# Patient Record
Sex: Male | Born: 1950
Health system: Southern US, Community
[De-identification: ages and names within clinical notes are randomized; demographics above are authoritative.]

## PROBLEM LIST (undated history)

## (undated) DIAGNOSIS — E785 Hyperlipidemia, unspecified: Secondary | ICD-10-CM

## (undated) DIAGNOSIS — D509 Iron deficiency anemia, unspecified: Secondary | ICD-10-CM

## (undated) DIAGNOSIS — I219 Acute myocardial infarction, unspecified: Secondary | ICD-10-CM

## (undated) DIAGNOSIS — G4730 Sleep apnea, unspecified: Secondary | ICD-10-CM

## (undated) DIAGNOSIS — J449 Chronic obstructive pulmonary disease, unspecified: Secondary | ICD-10-CM

## (undated) DIAGNOSIS — I2510 Atherosclerotic heart disease of native coronary artery without angina pectoris: Secondary | ICD-10-CM

## (undated) DIAGNOSIS — N200 Calculus of kidney: Secondary | ICD-10-CM

## (undated) DIAGNOSIS — I519 Heart disease, unspecified: Secondary | ICD-10-CM

## (undated) DIAGNOSIS — J439 Emphysema, unspecified: Secondary | ICD-10-CM

## (undated) DIAGNOSIS — I340 Nonrheumatic mitral (valve) insufficiency: Secondary | ICD-10-CM

## (undated) DIAGNOSIS — K219 Gastro-esophageal reflux disease without esophagitis: Secondary | ICD-10-CM

## (undated) DIAGNOSIS — Z720 Tobacco use: Secondary | ICD-10-CM

## (undated) DIAGNOSIS — E119 Type 2 diabetes mellitus without complications: Secondary | ICD-10-CM

## (undated) DIAGNOSIS — I10 Essential (primary) hypertension: Secondary | ICD-10-CM

## (undated) HISTORY — DX: Emphysema, unspecified: J43.9

## (undated) HISTORY — PX: CARDIAC CATHETERIZATION: SHX172

## (undated) HISTORY — PX: CORONARY ANGIOPLASTY WITH STENT PLACEMENT: SHX49

## (undated) HISTORY — DX: Calculus of kidney: N20.0

## (undated) HISTORY — DX: Chronic obstructive pulmonary disease, unspecified: J44.9

## (undated) HISTORY — DX: Gastro-esophageal reflux disease without esophagitis: K21.9

## (undated) HISTORY — PX: LUMBAR LAMINECTOMY: SHX95

## (undated) HISTORY — PX: LITHOTRIPSY: SUR834

## (undated) HISTORY — PX: KIDNEY STONE SURGERY: SHX686

---

## 2002-08-13 ENCOUNTER — Inpatient Hospital Stay (HOSPITAL_COMMUNITY): Admission: EM | Admit: 2002-08-13 | Discharge: 2002-08-16 | Payer: Self-pay | Admitting: Psychiatry

## 2014-09-04 DIAGNOSIS — R5382 Chronic fatigue, unspecified: Secondary | ICD-10-CM | POA: Diagnosis not present

## 2014-09-04 DIAGNOSIS — M129 Arthropathy, unspecified: Secondary | ICD-10-CM | POA: Diagnosis not present

## 2014-09-04 DIAGNOSIS — I10 Essential (primary) hypertension: Secondary | ICD-10-CM | POA: Diagnosis not present

## 2014-09-04 DIAGNOSIS — I2510 Atherosclerotic heart disease of native coronary artery without angina pectoris: Secondary | ICD-10-CM | POA: Diagnosis not present

## 2014-09-04 DIAGNOSIS — G894 Chronic pain syndrome: Secondary | ICD-10-CM | POA: Diagnosis not present

## 2014-09-11 DIAGNOSIS — G4700 Insomnia, unspecified: Secondary | ICD-10-CM | POA: Diagnosis not present

## 2014-09-11 DIAGNOSIS — M5134 Other intervertebral disc degeneration, thoracic region: Secondary | ICD-10-CM | POA: Diagnosis not present

## 2014-09-11 DIAGNOSIS — R5382 Chronic fatigue, unspecified: Secondary | ICD-10-CM | POA: Diagnosis not present

## 2014-09-11 DIAGNOSIS — G894 Chronic pain syndrome: Secondary | ICD-10-CM | POA: Diagnosis not present

## 2014-09-23 DIAGNOSIS — C44329 Squamous cell carcinoma of skin of other parts of face: Secondary | ICD-10-CM | POA: Diagnosis not present

## 2014-10-09 DIAGNOSIS — M4806 Spinal stenosis, lumbar region: Secondary | ICD-10-CM | POA: Diagnosis not present

## 2014-10-13 DIAGNOSIS — F419 Anxiety disorder, unspecified: Secondary | ICD-10-CM | POA: Diagnosis not present

## 2014-10-13 DIAGNOSIS — I10 Essential (primary) hypertension: Secondary | ICD-10-CM | POA: Diagnosis not present

## 2014-10-13 DIAGNOSIS — G894 Chronic pain syndrome: Secondary | ICD-10-CM | POA: Diagnosis not present

## 2014-10-13 DIAGNOSIS — E119 Type 2 diabetes mellitus without complications: Secondary | ICD-10-CM | POA: Diagnosis not present

## 2014-10-29 DIAGNOSIS — M4806 Spinal stenosis, lumbar region: Secondary | ICD-10-CM | POA: Diagnosis not present

## 2014-10-29 DIAGNOSIS — Z6826 Body mass index (BMI) 26.0-26.9, adult: Secondary | ICD-10-CM | POA: Diagnosis not present

## 2014-11-11 DIAGNOSIS — G894 Chronic pain syndrome: Secondary | ICD-10-CM | POA: Diagnosis not present

## 2014-11-11 DIAGNOSIS — E119 Type 2 diabetes mellitus without complications: Secondary | ICD-10-CM | POA: Diagnosis not present

## 2014-11-11 DIAGNOSIS — E785 Hyperlipidemia, unspecified: Secondary | ICD-10-CM | POA: Diagnosis not present

## 2014-11-11 DIAGNOSIS — I10 Essential (primary) hypertension: Secondary | ICD-10-CM | POA: Diagnosis not present

## 2014-11-17 DIAGNOSIS — J209 Acute bronchitis, unspecified: Secondary | ICD-10-CM | POA: Diagnosis not present

## 2014-12-08 DIAGNOSIS — I10 Essential (primary) hypertension: Secondary | ICD-10-CM | POA: Diagnosis not present

## 2014-12-08 DIAGNOSIS — K219 Gastro-esophageal reflux disease without esophagitis: Secondary | ICD-10-CM | POA: Diagnosis not present

## 2014-12-08 DIAGNOSIS — G8929 Other chronic pain: Secondary | ICD-10-CM | POA: Diagnosis not present

## 2014-12-08 DIAGNOSIS — F419 Anxiety disorder, unspecified: Secondary | ICD-10-CM | POA: Diagnosis not present

## 2014-12-31 DIAGNOSIS — M549 Dorsalgia, unspecified: Secondary | ICD-10-CM | POA: Diagnosis not present

## 2014-12-31 DIAGNOSIS — G8929 Other chronic pain: Secondary | ICD-10-CM | POA: Diagnosis not present

## 2014-12-31 DIAGNOSIS — Z79891 Long term (current) use of opiate analgesic: Secondary | ICD-10-CM | POA: Diagnosis not present

## 2014-12-31 DIAGNOSIS — Z760 Encounter for issue of repeat prescription: Secondary | ICD-10-CM | POA: Diagnosis not present

## 2015-01-01 DIAGNOSIS — E7800 Pure hypercholesterolemia, unspecified: Secondary | ICD-10-CM | POA: Diagnosis not present

## 2015-01-01 DIAGNOSIS — I10 Essential (primary) hypertension: Secondary | ICD-10-CM | POA: Diagnosis not present

## 2015-01-01 DIAGNOSIS — I2510 Atherosclerotic heart disease of native coronary artery without angina pectoris: Secondary | ICD-10-CM | POA: Diagnosis not present

## 2015-01-01 DIAGNOSIS — K7030 Alcoholic cirrhosis of liver without ascites: Secondary | ICD-10-CM | POA: Diagnosis not present

## 2015-01-01 DIAGNOSIS — D649 Anemia, unspecified: Secondary | ICD-10-CM | POA: Diagnosis not present

## 2015-01-05 DIAGNOSIS — I10 Essential (primary) hypertension: Secondary | ICD-10-CM | POA: Diagnosis not present

## 2015-01-05 DIAGNOSIS — Z79891 Long term (current) use of opiate analgesic: Secondary | ICD-10-CM | POA: Diagnosis not present

## 2015-01-05 DIAGNOSIS — Z5181 Encounter for therapeutic drug level monitoring: Secondary | ICD-10-CM | POA: Diagnosis not present

## 2015-01-05 DIAGNOSIS — E119 Type 2 diabetes mellitus without complications: Secondary | ICD-10-CM | POA: Diagnosis not present

## 2015-01-05 DIAGNOSIS — M129 Arthropathy, unspecified: Secondary | ICD-10-CM | POA: Diagnosis not present

## 2015-01-05 DIAGNOSIS — G894 Chronic pain syndrome: Secondary | ICD-10-CM | POA: Diagnosis not present

## 2015-01-05 DIAGNOSIS — F419 Anxiety disorder, unspecified: Secondary | ICD-10-CM | POA: Diagnosis not present

## 2015-01-08 DIAGNOSIS — E119 Type 2 diabetes mellitus without complications: Secondary | ICD-10-CM | POA: Diagnosis not present

## 2015-02-04 DIAGNOSIS — G8929 Other chronic pain: Secondary | ICD-10-CM | POA: Diagnosis not present

## 2015-02-04 DIAGNOSIS — I10 Essential (primary) hypertension: Secondary | ICD-10-CM | POA: Diagnosis not present

## 2015-02-04 DIAGNOSIS — K219 Gastro-esophageal reflux disease without esophagitis: Secondary | ICD-10-CM | POA: Diagnosis not present

## 2015-02-04 DIAGNOSIS — E119 Type 2 diabetes mellitus without complications: Secondary | ICD-10-CM | POA: Diagnosis not present

## 2015-02-04 DIAGNOSIS — G4700 Insomnia, unspecified: Secondary | ICD-10-CM | POA: Diagnosis not present

## 2015-02-06 DIAGNOSIS — J181 Lobar pneumonia, unspecified organism: Secondary | ICD-10-CM | POA: Diagnosis not present

## 2015-02-06 DIAGNOSIS — N390 Urinary tract infection, site not specified: Secondary | ICD-10-CM | POA: Diagnosis present

## 2015-02-06 DIAGNOSIS — I358 Other nonrheumatic aortic valve disorders: Secondary | ICD-10-CM | POA: Diagnosis not present

## 2015-02-06 DIAGNOSIS — I2510 Atherosclerotic heart disease of native coronary artery without angina pectoris: Secondary | ICD-10-CM | POA: Diagnosis not present

## 2015-02-06 DIAGNOSIS — Z7902 Long term (current) use of antithrombotics/antiplatelets: Secondary | ICD-10-CM | POA: Diagnosis not present

## 2015-02-06 DIAGNOSIS — I110 Hypertensive heart disease with heart failure: Secondary | ICD-10-CM | POA: Diagnosis present

## 2015-02-06 DIAGNOSIS — I10 Essential (primary) hypertension: Secondary | ICD-10-CM | POA: Diagnosis not present

## 2015-02-06 DIAGNOSIS — I5031 Acute diastolic (congestive) heart failure: Secondary | ICD-10-CM | POA: Diagnosis present

## 2015-02-06 DIAGNOSIS — Z7982 Long term (current) use of aspirin: Secondary | ICD-10-CM | POA: Diagnosis not present

## 2015-02-06 DIAGNOSIS — E785 Hyperlipidemia, unspecified: Secondary | ICD-10-CM | POA: Diagnosis not present

## 2015-02-06 DIAGNOSIS — E119 Type 2 diabetes mellitus without complications: Secondary | ICD-10-CM | POA: Diagnosis not present

## 2015-02-06 DIAGNOSIS — R0902 Hypoxemia: Secondary | ICD-10-CM | POA: Diagnosis not present

## 2015-02-06 DIAGNOSIS — I340 Nonrheumatic mitral (valve) insufficiency: Secondary | ICD-10-CM | POA: Diagnosis not present

## 2015-02-06 DIAGNOSIS — J811 Chronic pulmonary edema: Secondary | ICD-10-CM | POA: Diagnosis not present

## 2015-02-06 DIAGNOSIS — Z951 Presence of aortocoronary bypass graft: Secondary | ICD-10-CM | POA: Diagnosis not present

## 2015-02-06 DIAGNOSIS — J159 Unspecified bacterial pneumonia: Secondary | ICD-10-CM | POA: Diagnosis not present

## 2015-02-06 DIAGNOSIS — F17218 Nicotine dependence, cigarettes, with other nicotine-induced disorders: Secondary | ICD-10-CM | POA: Diagnosis present

## 2015-02-06 DIAGNOSIS — I517 Cardiomegaly: Secondary | ICD-10-CM | POA: Diagnosis not present

## 2015-02-06 DIAGNOSIS — B952 Enterococcus as the cause of diseases classified elsewhere: Secondary | ICD-10-CM | POA: Diagnosis present

## 2015-02-06 DIAGNOSIS — G8929 Other chronic pain: Secondary | ICD-10-CM | POA: Diagnosis present

## 2015-02-06 DIAGNOSIS — I214 Non-ST elevation (NSTEMI) myocardial infarction: Secondary | ICD-10-CM | POA: Diagnosis not present

## 2015-02-06 DIAGNOSIS — R0789 Other chest pain: Secondary | ICD-10-CM | POA: Diagnosis not present

## 2015-02-06 DIAGNOSIS — R0600 Dyspnea, unspecified: Secondary | ICD-10-CM | POA: Diagnosis not present

## 2015-02-06 DIAGNOSIS — J441 Chronic obstructive pulmonary disease with (acute) exacerbation: Secondary | ICD-10-CM | POA: Diagnosis not present

## 2015-02-06 DIAGNOSIS — Z79899 Other long term (current) drug therapy: Secondary | ICD-10-CM | POA: Diagnosis not present

## 2015-02-06 DIAGNOSIS — I479 Paroxysmal tachycardia, unspecified: Secondary | ICD-10-CM | POA: Diagnosis not present

## 2015-02-06 DIAGNOSIS — M545 Low back pain: Secondary | ICD-10-CM | POA: Diagnosis not present

## 2015-02-06 DIAGNOSIS — R9431 Abnormal electrocardiogram [ECG] [EKG]: Secondary | ICD-10-CM | POA: Diagnosis not present

## 2015-02-06 DIAGNOSIS — Z716 Tobacco abuse counseling: Secondary | ICD-10-CM | POA: Diagnosis not present

## 2015-02-06 DIAGNOSIS — K219 Gastro-esophageal reflux disease without esophagitis: Secondary | ICD-10-CM | POA: Diagnosis present

## 2015-02-06 DIAGNOSIS — J9601 Acute respiratory failure with hypoxia: Secondary | ICD-10-CM | POA: Diagnosis not present

## 2015-02-06 DIAGNOSIS — R509 Fever, unspecified: Secondary | ICD-10-CM | POA: Diagnosis not present

## 2015-02-06 DIAGNOSIS — R079 Chest pain, unspecified: Secondary | ICD-10-CM | POA: Diagnosis not present

## 2015-02-26 DIAGNOSIS — E7800 Pure hypercholesterolemia, unspecified: Secondary | ICD-10-CM | POA: Diagnosis not present

## 2015-02-26 DIAGNOSIS — I10 Essential (primary) hypertension: Secondary | ICD-10-CM | POA: Diagnosis not present

## 2015-02-26 DIAGNOSIS — I229 Subsequent ST elevation (STEMI) myocardial infarction of unspecified site: Secondary | ICD-10-CM | POA: Diagnosis not present

## 2015-02-26 DIAGNOSIS — I2510 Atherosclerotic heart disease of native coronary artery without angina pectoris: Secondary | ICD-10-CM | POA: Diagnosis not present

## 2015-03-12 DIAGNOSIS — S0101XA Laceration without foreign body of scalp, initial encounter: Secondary | ICD-10-CM | POA: Diagnosis not present

## 2015-03-12 DIAGNOSIS — Z23 Encounter for immunization: Secondary | ICD-10-CM | POA: Diagnosis not present

## 2015-03-12 DIAGNOSIS — F419 Anxiety disorder, unspecified: Secondary | ICD-10-CM | POA: Diagnosis not present

## 2015-03-12 DIAGNOSIS — F17200 Nicotine dependence, unspecified, uncomplicated: Secondary | ICD-10-CM | POA: Diagnosis not present

## 2015-03-12 DIAGNOSIS — S0003XA Contusion of scalp, initial encounter: Secondary | ICD-10-CM | POA: Diagnosis not present

## 2015-03-12 DIAGNOSIS — Z79899 Other long term (current) drug therapy: Secondary | ICD-10-CM | POA: Diagnosis not present

## 2015-03-12 DIAGNOSIS — M542 Cervicalgia: Secondary | ICD-10-CM | POA: Diagnosis not present

## 2015-03-12 DIAGNOSIS — Z7902 Long term (current) use of antithrombotics/antiplatelets: Secondary | ICD-10-CM | POA: Diagnosis not present

## 2015-03-12 DIAGNOSIS — R51 Headache: Secondary | ICD-10-CM | POA: Diagnosis not present

## 2015-03-12 DIAGNOSIS — I2510 Atherosclerotic heart disease of native coronary artery without angina pectoris: Secondary | ICD-10-CM | POA: Diagnosis not present

## 2015-03-12 DIAGNOSIS — J449 Chronic obstructive pulmonary disease, unspecified: Secondary | ICD-10-CM | POA: Diagnosis not present

## 2015-03-12 DIAGNOSIS — I10 Essential (primary) hypertension: Secondary | ICD-10-CM | POA: Diagnosis not present

## 2015-03-12 DIAGNOSIS — K219 Gastro-esophageal reflux disease without esophagitis: Secondary | ICD-10-CM | POA: Diagnosis not present

## 2015-03-12 DIAGNOSIS — E7800 Pure hypercholesterolemia, unspecified: Secondary | ICD-10-CM | POA: Diagnosis not present

## 2015-03-12 DIAGNOSIS — E119 Type 2 diabetes mellitus without complications: Secondary | ICD-10-CM | POA: Diagnosis not present

## 2015-03-12 DIAGNOSIS — S01111A Laceration without foreign body of right eyelid and periocular area, initial encounter: Secondary | ICD-10-CM | POA: Diagnosis not present

## 2015-03-12 DIAGNOSIS — Z7982 Long term (current) use of aspirin: Secondary | ICD-10-CM | POA: Diagnosis not present

## 2015-04-01 DIAGNOSIS — G8929 Other chronic pain: Secondary | ICD-10-CM | POA: Diagnosis not present

## 2015-04-01 DIAGNOSIS — M545 Low back pain: Secondary | ICD-10-CM | POA: Diagnosis not present

## 2015-04-01 DIAGNOSIS — Z9114 Patient's other noncompliance with medication regimen: Secondary | ICD-10-CM | POA: Diagnosis not present

## 2015-04-02 DIAGNOSIS — G894 Chronic pain syndrome: Secondary | ICD-10-CM | POA: Diagnosis not present

## 2015-04-02 DIAGNOSIS — F419 Anxiety disorder, unspecified: Secondary | ICD-10-CM | POA: Diagnosis not present

## 2015-04-02 DIAGNOSIS — M129 Arthropathy, unspecified: Secondary | ICD-10-CM | POA: Diagnosis not present

## 2015-04-02 DIAGNOSIS — I10 Essential (primary) hypertension: Secondary | ICD-10-CM | POA: Diagnosis not present

## 2015-04-09 DIAGNOSIS — R079 Chest pain, unspecified: Secondary | ICD-10-CM | POA: Diagnosis not present

## 2015-04-29 DIAGNOSIS — F419 Anxiety disorder, unspecified: Secondary | ICD-10-CM | POA: Diagnosis not present

## 2015-04-29 DIAGNOSIS — R5383 Other fatigue: Secondary | ICD-10-CM | POA: Diagnosis not present

## 2015-04-29 DIAGNOSIS — I2510 Atherosclerotic heart disease of native coronary artery without angina pectoris: Secondary | ICD-10-CM | POA: Diagnosis not present

## 2015-04-29 DIAGNOSIS — G894 Chronic pain syndrome: Secondary | ICD-10-CM | POA: Diagnosis not present

## 2015-05-05 DIAGNOSIS — R000 Tachycardia, unspecified: Secondary | ICD-10-CM | POA: Diagnosis not present

## 2015-05-05 DIAGNOSIS — I2510 Atherosclerotic heart disease of native coronary artery without angina pectoris: Secondary | ICD-10-CM | POA: Diagnosis not present

## 2015-06-04 DIAGNOSIS — D649 Anemia, unspecified: Secondary | ICD-10-CM | POA: Diagnosis not present

## 2015-06-04 DIAGNOSIS — I214 Non-ST elevation (NSTEMI) myocardial infarction: Secondary | ICD-10-CM | POA: Diagnosis not present

## 2015-06-04 DIAGNOSIS — I10 Essential (primary) hypertension: Secondary | ICD-10-CM | POA: Diagnosis not present

## 2015-06-04 DIAGNOSIS — R079 Chest pain, unspecified: Secondary | ICD-10-CM | POA: Diagnosis not present

## 2015-06-04 DIAGNOSIS — E119 Type 2 diabetes mellitus without complications: Secondary | ICD-10-CM | POA: Diagnosis not present

## 2015-06-04 DIAGNOSIS — J069 Acute upper respiratory infection, unspecified: Secondary | ICD-10-CM | POA: Diagnosis not present

## 2015-06-05 DIAGNOSIS — I10 Essential (primary) hypertension: Secondary | ICD-10-CM | POA: Diagnosis not present

## 2015-06-05 DIAGNOSIS — E119 Type 2 diabetes mellitus without complications: Secondary | ICD-10-CM | POA: Diagnosis not present

## 2015-06-05 DIAGNOSIS — R079 Chest pain, unspecified: Secondary | ICD-10-CM | POA: Diagnosis not present

## 2015-06-05 DIAGNOSIS — I214 Non-ST elevation (NSTEMI) myocardial infarction: Secondary | ICD-10-CM | POA: Diagnosis not present

## 2015-06-05 DIAGNOSIS — D649 Anemia, unspecified: Secondary | ICD-10-CM | POA: Diagnosis not present

## 2015-06-05 DIAGNOSIS — J069 Acute upper respiratory infection, unspecified: Secondary | ICD-10-CM | POA: Diagnosis not present

## 2015-06-06 DIAGNOSIS — R079 Chest pain, unspecified: Secondary | ICD-10-CM | POA: Diagnosis not present

## 2015-06-06 DIAGNOSIS — J069 Acute upper respiratory infection, unspecified: Secondary | ICD-10-CM | POA: Diagnosis not present

## 2015-06-06 DIAGNOSIS — I10 Essential (primary) hypertension: Secondary | ICD-10-CM | POA: Diagnosis not present

## 2015-06-06 DIAGNOSIS — E119 Type 2 diabetes mellitus without complications: Secondary | ICD-10-CM | POA: Diagnosis not present

## 2015-06-06 DIAGNOSIS — D649 Anemia, unspecified: Secondary | ICD-10-CM | POA: Diagnosis not present

## 2015-06-07 DIAGNOSIS — E119 Type 2 diabetes mellitus without complications: Secondary | ICD-10-CM | POA: Diagnosis not present

## 2015-06-07 DIAGNOSIS — J069 Acute upper respiratory infection, unspecified: Secondary | ICD-10-CM | POA: Diagnosis not present

## 2015-06-07 DIAGNOSIS — D649 Anemia, unspecified: Secondary | ICD-10-CM | POA: Diagnosis not present

## 2015-06-07 DIAGNOSIS — I10 Essential (primary) hypertension: Secondary | ICD-10-CM | POA: Diagnosis not present

## 2015-06-07 DIAGNOSIS — R079 Chest pain, unspecified: Secondary | ICD-10-CM | POA: Diagnosis not present

## 2015-09-16 DIAGNOSIS — F331 Major depressive disorder, recurrent, moderate: Secondary | ICD-10-CM | POA: Diagnosis not present

## 2015-09-22 DIAGNOSIS — R531 Weakness: Secondary | ICD-10-CM | POA: Diagnosis not present

## 2015-09-22 DIAGNOSIS — R918 Other nonspecific abnormal finding of lung field: Secondary | ICD-10-CM | POA: Diagnosis not present

## 2015-09-22 DIAGNOSIS — I249 Acute ischemic heart disease, unspecified: Secondary | ICD-10-CM | POA: Diagnosis not present

## 2015-09-23 DIAGNOSIS — R531 Weakness: Secondary | ICD-10-CM | POA: Diagnosis not present

## 2015-09-23 DIAGNOSIS — R918 Other nonspecific abnormal finding of lung field: Secondary | ICD-10-CM | POA: Diagnosis not present

## 2015-09-23 DIAGNOSIS — I249 Acute ischemic heart disease, unspecified: Secondary | ICD-10-CM | POA: Diagnosis not present

## 2015-09-23 DIAGNOSIS — F17210 Nicotine dependence, cigarettes, uncomplicated: Secondary | ICD-10-CM | POA: Diagnosis present

## 2015-09-23 DIAGNOSIS — I10 Essential (primary) hypertension: Secondary | ICD-10-CM | POA: Diagnosis present

## 2015-09-23 DIAGNOSIS — E119 Type 2 diabetes mellitus without complications: Secondary | ICD-10-CM | POA: Diagnosis not present

## 2015-09-23 DIAGNOSIS — Z79899 Other long term (current) drug therapy: Secondary | ICD-10-CM | POA: Diagnosis not present

## 2015-09-23 DIAGNOSIS — R748 Abnormal levels of other serum enzymes: Secondary | ICD-10-CM | POA: Diagnosis not present

## 2015-09-23 DIAGNOSIS — J449 Chronic obstructive pulmonary disease, unspecified: Secondary | ICD-10-CM | POA: Diagnosis not present

## 2015-09-23 DIAGNOSIS — Z7982 Long term (current) use of aspirin: Secondary | ICD-10-CM | POA: Diagnosis not present

## 2015-09-23 DIAGNOSIS — I2510 Atherosclerotic heart disease of native coronary artery without angina pectoris: Secondary | ICD-10-CM | POA: Diagnosis not present

## 2015-09-23 DIAGNOSIS — E11649 Type 2 diabetes mellitus with hypoglycemia without coma: Secondary | ICD-10-CM | POA: Diagnosis present

## 2015-09-23 DIAGNOSIS — I252 Old myocardial infarction: Secondary | ICD-10-CM | POA: Diagnosis not present

## 2015-09-23 DIAGNOSIS — M1990 Unspecified osteoarthritis, unspecified site: Secondary | ICD-10-CM | POA: Diagnosis present

## 2015-09-23 DIAGNOSIS — Z9861 Coronary angioplasty status: Secondary | ICD-10-CM | POA: Diagnosis not present

## 2015-09-23 DIAGNOSIS — E7800 Pure hypercholesterolemia, unspecified: Secondary | ICD-10-CM | POA: Diagnosis present

## 2015-09-23 DIAGNOSIS — E785 Hyperlipidemia, unspecified: Secondary | ICD-10-CM | POA: Diagnosis not present

## 2015-09-23 DIAGNOSIS — K219 Gastro-esophageal reflux disease without esophagitis: Secondary | ICD-10-CM | POA: Diagnosis present

## 2015-09-23 DIAGNOSIS — F418 Other specified anxiety disorders: Secondary | ICD-10-CM | POA: Diagnosis present

## 2015-09-23 DIAGNOSIS — I214 Non-ST elevation (NSTEMI) myocardial infarction: Secondary | ICD-10-CM | POA: Diagnosis not present

## 2015-09-23 DIAGNOSIS — R079 Chest pain, unspecified: Secondary | ICD-10-CM | POA: Diagnosis not present

## 2015-09-23 DIAGNOSIS — R42 Dizziness and giddiness: Secondary | ICD-10-CM | POA: Diagnosis not present

## 2015-10-08 DIAGNOSIS — I2510 Atherosclerotic heart disease of native coronary artery without angina pectoris: Secondary | ICD-10-CM | POA: Diagnosis not present

## 2015-10-08 DIAGNOSIS — I10 Essential (primary) hypertension: Secondary | ICD-10-CM | POA: Diagnosis not present

## 2015-10-08 DIAGNOSIS — E7800 Pure hypercholesterolemia, unspecified: Secondary | ICD-10-CM | POA: Diagnosis not present

## 2015-10-10 DIAGNOSIS — R51 Headache: Secondary | ICD-10-CM | POA: Diagnosis not present

## 2015-10-10 DIAGNOSIS — R4182 Altered mental status, unspecified: Secondary | ICD-10-CM | POA: Diagnosis not present

## 2015-10-10 DIAGNOSIS — R402441 Other coma, without documented Glasgow coma scale score, or with partial score reported, in the field [EMT or ambulance]: Secondary | ICD-10-CM | POA: Diagnosis not present

## 2015-11-19 DIAGNOSIS — G894 Chronic pain syndrome: Secondary | ICD-10-CM | POA: Diagnosis not present

## 2015-12-21 DIAGNOSIS — L259 Unspecified contact dermatitis, unspecified cause: Secondary | ICD-10-CM | POA: Diagnosis not present

## 2015-12-21 DIAGNOSIS — M1990 Unspecified osteoarthritis, unspecified site: Secondary | ICD-10-CM | POA: Diagnosis not present

## 2015-12-21 DIAGNOSIS — F419 Anxiety disorder, unspecified: Secondary | ICD-10-CM | POA: Diagnosis not present

## 2015-12-21 DIAGNOSIS — G894 Chronic pain syndrome: Secondary | ICD-10-CM | POA: Diagnosis not present

## 2016-01-09 DIAGNOSIS — J189 Pneumonia, unspecified organism: Secondary | ICD-10-CM | POA: Diagnosis not present

## 2016-01-21 DIAGNOSIS — M1990 Unspecified osteoarthritis, unspecified site: Secondary | ICD-10-CM | POA: Diagnosis not present

## 2016-01-21 DIAGNOSIS — F419 Anxiety disorder, unspecified: Secondary | ICD-10-CM | POA: Diagnosis not present

## 2016-01-21 DIAGNOSIS — G894 Chronic pain syndrome: Secondary | ICD-10-CM | POA: Diagnosis not present

## 2016-02-23 DIAGNOSIS — M1990 Unspecified osteoarthritis, unspecified site: Secondary | ICD-10-CM | POA: Diagnosis not present

## 2016-02-23 DIAGNOSIS — I10 Essential (primary) hypertension: Secondary | ICD-10-CM | POA: Diagnosis not present

## 2016-02-23 DIAGNOSIS — G894 Chronic pain syndrome: Secondary | ICD-10-CM | POA: Diagnosis not present

## 2016-02-23 DIAGNOSIS — F419 Anxiety disorder, unspecified: Secondary | ICD-10-CM | POA: Diagnosis not present

## 2016-03-11 DIAGNOSIS — S0990XA Unspecified injury of head, initial encounter: Secondary | ICD-10-CM | POA: Diagnosis not present

## 2016-03-11 DIAGNOSIS — S0101XA Laceration without foreign body of scalp, initial encounter: Secondary | ICD-10-CM | POA: Diagnosis not present

## 2016-03-21 DIAGNOSIS — L578 Other skin changes due to chronic exposure to nonionizing radiation: Secondary | ICD-10-CM | POA: Diagnosis not present

## 2016-03-21 DIAGNOSIS — L821 Other seborrheic keratosis: Secondary | ICD-10-CM | POA: Diagnosis not present

## 2016-03-21 DIAGNOSIS — L570 Actinic keratosis: Secondary | ICD-10-CM | POA: Diagnosis not present

## 2016-03-21 DIAGNOSIS — C44519 Basal cell carcinoma of skin of other part of trunk: Secondary | ICD-10-CM | POA: Diagnosis not present

## 2016-04-03 DIAGNOSIS — Z7982 Long term (current) use of aspirin: Secondary | ICD-10-CM | POA: Diagnosis not present

## 2016-04-03 DIAGNOSIS — J449 Chronic obstructive pulmonary disease, unspecified: Secondary | ICD-10-CM | POA: Diagnosis not present

## 2016-04-03 DIAGNOSIS — S0990XA Unspecified injury of head, initial encounter: Secondary | ICD-10-CM | POA: Diagnosis not present

## 2016-04-03 DIAGNOSIS — R531 Weakness: Secondary | ICD-10-CM | POA: Diagnosis not present

## 2016-04-03 DIAGNOSIS — S0121XA Laceration without foreign body of nose, initial encounter: Secondary | ICD-10-CM | POA: Diagnosis not present

## 2016-04-03 DIAGNOSIS — F17200 Nicotine dependence, unspecified, uncomplicated: Secondary | ICD-10-CM | POA: Diagnosis not present

## 2016-04-03 DIAGNOSIS — F17218 Nicotine dependence, cigarettes, with other nicotine-induced disorders: Secondary | ICD-10-CM | POA: Diagnosis not present

## 2016-04-03 DIAGNOSIS — I2510 Atherosclerotic heart disease of native coronary artery without angina pectoris: Secondary | ICD-10-CM | POA: Diagnosis not present

## 2016-04-03 DIAGNOSIS — Z7902 Long term (current) use of antithrombotics/antiplatelets: Secondary | ICD-10-CM | POA: Diagnosis not present

## 2016-04-03 DIAGNOSIS — Z79899 Other long term (current) drug therapy: Secondary | ICD-10-CM | POA: Diagnosis not present

## 2016-04-03 DIAGNOSIS — K219 Gastro-esophageal reflux disease without esophagitis: Secondary | ICD-10-CM | POA: Diagnosis not present

## 2016-04-03 DIAGNOSIS — E119 Type 2 diabetes mellitus without complications: Secondary | ICD-10-CM | POA: Diagnosis not present

## 2016-04-03 DIAGNOSIS — I252 Old myocardial infarction: Secondary | ICD-10-CM

## 2016-04-03 DIAGNOSIS — Z716 Tobacco abuse counseling: Secondary | ICD-10-CM | POA: Diagnosis not present

## 2016-04-03 DIAGNOSIS — R4182 Altered mental status, unspecified: Secondary | ICD-10-CM

## 2016-04-03 DIAGNOSIS — R42 Dizziness and giddiness: Secondary | ICD-10-CM | POA: Diagnosis not present

## 2016-04-03 DIAGNOSIS — R0602 Shortness of breath: Secondary | ICD-10-CM | POA: Diagnosis not present

## 2016-04-04 DIAGNOSIS — S0990XA Unspecified injury of head, initial encounter: Secondary | ICD-10-CM | POA: Diagnosis not present

## 2016-04-04 DIAGNOSIS — I6523 Occlusion and stenosis of bilateral carotid arteries: Secondary | ICD-10-CM | POA: Diagnosis not present

## 2016-04-04 DIAGNOSIS — I2510 Atherosclerotic heart disease of native coronary artery without angina pectoris: Secondary | ICD-10-CM | POA: Diagnosis not present

## 2016-04-04 DIAGNOSIS — I517 Cardiomegaly: Secondary | ICD-10-CM | POA: Diagnosis not present

## 2016-04-04 DIAGNOSIS — I252 Old myocardial infarction: Secondary | ICD-10-CM | POA: Diagnosis not present

## 2016-04-04 DIAGNOSIS — I350 Nonrheumatic aortic (valve) stenosis: Secondary | ICD-10-CM | POA: Diagnosis not present

## 2016-04-04 DIAGNOSIS — R4182 Altered mental status, unspecified: Secondary | ICD-10-CM | POA: Diagnosis not present

## 2016-04-04 DIAGNOSIS — Z951 Presence of aortocoronary bypass graft: Secondary | ICD-10-CM | POA: Diagnosis not present

## 2016-04-04 DIAGNOSIS — Z8249 Family history of ischemic heart disease and other diseases of the circulatory system: Secondary | ICD-10-CM | POA: Diagnosis not present

## 2016-04-04 DIAGNOSIS — F17210 Nicotine dependence, cigarettes, uncomplicated: Secondary | ICD-10-CM | POA: Diagnosis not present

## 2016-04-12 DIAGNOSIS — R05 Cough: Secondary | ICD-10-CM | POA: Diagnosis not present

## 2016-04-12 DIAGNOSIS — R0600 Dyspnea, unspecified: Secondary | ICD-10-CM | POA: Diagnosis not present

## 2016-04-12 DIAGNOSIS — R112 Nausea with vomiting, unspecified: Secondary | ICD-10-CM | POA: Diagnosis not present

## 2016-04-12 DIAGNOSIS — R062 Wheezing: Secondary | ICD-10-CM | POA: Diagnosis not present

## 2016-04-12 DIAGNOSIS — R0602 Shortness of breath: Secondary | ICD-10-CM | POA: Diagnosis not present

## 2016-04-13 DIAGNOSIS — F17210 Nicotine dependence, cigarettes, uncomplicated: Secondary | ICD-10-CM | POA: Diagnosis not present

## 2016-04-13 DIAGNOSIS — I10 Essential (primary) hypertension: Secondary | ICD-10-CM | POA: Diagnosis not present

## 2016-04-13 DIAGNOSIS — E785 Hyperlipidemia, unspecified: Secondary | ICD-10-CM | POA: Diagnosis not present

## 2016-04-13 DIAGNOSIS — I2510 Atherosclerotic heart disease of native coronary artery without angina pectoris: Secondary | ICD-10-CM | POA: Diagnosis not present

## 2016-05-07 DIAGNOSIS — R300 Dysuria: Secondary | ICD-10-CM | POA: Diagnosis not present

## 2016-05-07 DIAGNOSIS — N3090 Cystitis, unspecified without hematuria: Secondary | ICD-10-CM | POA: Diagnosis not present

## 2016-05-07 DIAGNOSIS — N419 Inflammatory disease of prostate, unspecified: Secondary | ICD-10-CM | POA: Diagnosis not present

## 2016-05-13 DIAGNOSIS — I6789 Other cerebrovascular disease: Secondary | ICD-10-CM | POA: Diagnosis not present

## 2016-05-13 DIAGNOSIS — F13231 Sedative, hypnotic or anxiolytic dependence with withdrawal delirium: Secondary | ICD-10-CM

## 2016-05-13 DIAGNOSIS — E785 Hyperlipidemia, unspecified: Secondary | ICD-10-CM

## 2016-05-13 DIAGNOSIS — S0990XA Unspecified injury of head, initial encounter: Secondary | ICD-10-CM | POA: Diagnosis not present

## 2016-05-13 DIAGNOSIS — K219 Gastro-esophageal reflux disease without esophagitis: Secondary | ICD-10-CM | POA: Diagnosis not present

## 2016-05-13 DIAGNOSIS — F1310 Sedative, hypnotic or anxiolytic abuse, uncomplicated: Secondary | ICD-10-CM | POA: Diagnosis not present

## 2016-05-13 DIAGNOSIS — E119 Type 2 diabetes mellitus without complications: Secondary | ICD-10-CM | POA: Diagnosis not present

## 2016-05-13 DIAGNOSIS — F1110 Opioid abuse, uncomplicated: Secondary | ICD-10-CM | POA: Diagnosis not present

## 2016-05-13 DIAGNOSIS — R4182 Altered mental status, unspecified: Secondary | ICD-10-CM | POA: Diagnosis not present

## 2016-05-13 DIAGNOSIS — F4489 Other dissociative and conversion disorders: Secondary | ICD-10-CM | POA: Diagnosis not present

## 2016-05-13 DIAGNOSIS — I2510 Atherosclerotic heart disease of native coronary artery without angina pectoris: Secondary | ICD-10-CM

## 2016-05-14 DIAGNOSIS — E785 Hyperlipidemia, unspecified: Secondary | ICD-10-CM | POA: Diagnosis not present

## 2016-05-14 DIAGNOSIS — K219 Gastro-esophageal reflux disease without esophagitis: Secondary | ICD-10-CM | POA: Diagnosis not present

## 2016-05-14 DIAGNOSIS — F1310 Sedative, hypnotic or anxiolytic abuse, uncomplicated: Secondary | ICD-10-CM | POA: Diagnosis not present

## 2016-05-14 DIAGNOSIS — E119 Type 2 diabetes mellitus without complications: Secondary | ICD-10-CM | POA: Diagnosis not present

## 2016-05-14 DIAGNOSIS — F13231 Sedative, hypnotic or anxiolytic dependence with withdrawal delirium: Secondary | ICD-10-CM | POA: Diagnosis not present

## 2016-05-14 DIAGNOSIS — I2510 Atherosclerotic heart disease of native coronary artery without angina pectoris: Secondary | ICD-10-CM | POA: Diagnosis not present

## 2016-05-14 DIAGNOSIS — F1110 Opioid abuse, uncomplicated: Secondary | ICD-10-CM | POA: Diagnosis not present

## 2016-05-15 DIAGNOSIS — F1310 Sedative, hypnotic or anxiolytic abuse, uncomplicated: Secondary | ICD-10-CM | POA: Diagnosis not present

## 2016-05-15 DIAGNOSIS — K219 Gastro-esophageal reflux disease without esophagitis: Secondary | ICD-10-CM | POA: Diagnosis not present

## 2016-05-15 DIAGNOSIS — F13231 Sedative, hypnotic or anxiolytic dependence with withdrawal delirium: Secondary | ICD-10-CM | POA: Diagnosis not present

## 2016-05-15 DIAGNOSIS — I2510 Atherosclerotic heart disease of native coronary artery without angina pectoris: Secondary | ICD-10-CM | POA: Diagnosis not present

## 2016-05-15 DIAGNOSIS — E119 Type 2 diabetes mellitus without complications: Secondary | ICD-10-CM | POA: Diagnosis not present

## 2016-05-15 DIAGNOSIS — F1110 Opioid abuse, uncomplicated: Secondary | ICD-10-CM | POA: Diagnosis not present

## 2016-05-15 DIAGNOSIS — E785 Hyperlipidemia, unspecified: Secondary | ICD-10-CM | POA: Diagnosis not present

## 2016-05-16 DIAGNOSIS — F13231 Sedative, hypnotic or anxiolytic dependence with withdrawal delirium: Secondary | ICD-10-CM | POA: Diagnosis not present

## 2016-05-16 DIAGNOSIS — E119 Type 2 diabetes mellitus without complications: Secondary | ICD-10-CM | POA: Diagnosis not present

## 2016-05-16 DIAGNOSIS — I2510 Atherosclerotic heart disease of native coronary artery without angina pectoris: Secondary | ICD-10-CM | POA: Diagnosis not present

## 2016-05-16 DIAGNOSIS — E785 Hyperlipidemia, unspecified: Secondary | ICD-10-CM | POA: Diagnosis not present

## 2016-05-16 DIAGNOSIS — F1110 Opioid abuse, uncomplicated: Secondary | ICD-10-CM | POA: Diagnosis not present

## 2016-05-16 DIAGNOSIS — K219 Gastro-esophageal reflux disease without esophagitis: Secondary | ICD-10-CM | POA: Diagnosis not present

## 2016-05-16 DIAGNOSIS — R410 Disorientation, unspecified: Secondary | ICD-10-CM | POA: Diagnosis not present

## 2016-05-16 DIAGNOSIS — F1310 Sedative, hypnotic or anxiolytic abuse, uncomplicated: Secondary | ICD-10-CM | POA: Diagnosis not present

## 2016-05-23 DIAGNOSIS — R296 Repeated falls: Secondary | ICD-10-CM | POA: Diagnosis not present

## 2016-05-23 DIAGNOSIS — G894 Chronic pain syndrome: Secondary | ICD-10-CM | POA: Diagnosis not present

## 2016-05-23 DIAGNOSIS — F419 Anxiety disorder, unspecified: Secondary | ICD-10-CM | POA: Diagnosis not present

## 2016-05-26 DIAGNOSIS — F339 Major depressive disorder, recurrent, unspecified: Secondary | ICD-10-CM | POA: Diagnosis not present

## 2016-05-26 DIAGNOSIS — M545 Low back pain: Secondary | ICD-10-CM | POA: Diagnosis not present

## 2016-05-26 DIAGNOSIS — F419 Anxiety disorder, unspecified: Secondary | ICD-10-CM | POA: Diagnosis not present

## 2016-05-26 DIAGNOSIS — I2584 Coronary atherosclerosis due to calcified coronary lesion: Secondary | ICD-10-CM | POA: Diagnosis not present

## 2016-05-26 DIAGNOSIS — M1991 Primary osteoarthritis, unspecified site: Secondary | ICD-10-CM | POA: Diagnosis not present

## 2016-05-29 DIAGNOSIS — R930 Abnormal findings on diagnostic imaging of skull and head, not elsewhere classified: Secondary | ICD-10-CM | POA: Diagnosis not present

## 2016-05-29 DIAGNOSIS — H532 Diplopia: Secondary | ICD-10-CM | POA: Diagnosis not present

## 2016-05-29 DIAGNOSIS — R42 Dizziness and giddiness: Secondary | ICD-10-CM | POA: Diagnosis not present

## 2016-05-31 DIAGNOSIS — M47816 Spondylosis without myelopathy or radiculopathy, lumbar region: Secondary | ICD-10-CM | POA: Diagnosis not present

## 2016-05-31 DIAGNOSIS — Z720 Tobacco use: Secondary | ICD-10-CM | POA: Diagnosis not present

## 2016-05-31 DIAGNOSIS — M961 Postlaminectomy syndrome, not elsewhere classified: Secondary | ICD-10-CM | POA: Diagnosis not present

## 2016-05-31 DIAGNOSIS — J449 Chronic obstructive pulmonary disease, unspecified: Secondary | ICD-10-CM | POA: Diagnosis not present

## 2016-05-31 DIAGNOSIS — M5416 Radiculopathy, lumbar region: Secondary | ICD-10-CM | POA: Diagnosis not present

## 2016-05-31 DIAGNOSIS — M5136 Other intervertebral disc degeneration, lumbar region: Secondary | ICD-10-CM | POA: Diagnosis not present

## 2016-05-31 DIAGNOSIS — G894 Chronic pain syndrome: Secondary | ICD-10-CM | POA: Diagnosis not present

## 2016-06-05 DIAGNOSIS — R0602 Shortness of breath: Secondary | ICD-10-CM | POA: Diagnosis not present

## 2016-06-13 DIAGNOSIS — Z79899 Other long term (current) drug therapy: Secondary | ICD-10-CM | POA: Diagnosis not present

## 2016-06-30 DIAGNOSIS — M545 Low back pain: Secondary | ICD-10-CM | POA: Diagnosis not present

## 2016-06-30 DIAGNOSIS — M549 Dorsalgia, unspecified: Secondary | ICD-10-CM | POA: Diagnosis not present

## 2016-06-30 DIAGNOSIS — M4316 Spondylolisthesis, lumbar region: Secondary | ICD-10-CM | POA: Diagnosis not present

## 2016-06-30 DIAGNOSIS — M5416 Radiculopathy, lumbar region: Secondary | ICD-10-CM | POA: Diagnosis not present

## 2016-06-30 DIAGNOSIS — M5136 Other intervertebral disc degeneration, lumbar region: Secondary | ICD-10-CM | POA: Diagnosis not present

## 2016-07-06 DIAGNOSIS — Z9181 History of falling: Secondary | ICD-10-CM | POA: Diagnosis not present

## 2016-07-06 DIAGNOSIS — K219 Gastro-esophageal reflux disease without esophagitis: Secondary | ICD-10-CM | POA: Diagnosis not present

## 2016-07-06 DIAGNOSIS — I10 Essential (primary) hypertension: Secondary | ICD-10-CM | POA: Diagnosis not present

## 2016-07-06 DIAGNOSIS — Z1389 Encounter for screening for other disorder: Secondary | ICD-10-CM | POA: Diagnosis not present

## 2016-07-06 DIAGNOSIS — N400 Enlarged prostate without lower urinary tract symptoms: Secondary | ICD-10-CM | POA: Diagnosis not present

## 2016-07-06 DIAGNOSIS — E119 Type 2 diabetes mellitus without complications: Secondary | ICD-10-CM | POA: Diagnosis not present

## 2016-08-09 DIAGNOSIS — Z79899 Other long term (current) drug therapy: Secondary | ICD-10-CM | POA: Diagnosis not present

## 2016-08-09 DIAGNOSIS — Z6829 Body mass index (BMI) 29.0-29.9, adult: Secondary | ICD-10-CM | POA: Diagnosis not present

## 2016-08-09 DIAGNOSIS — I10 Essential (primary) hypertension: Secondary | ICD-10-CM | POA: Diagnosis not present

## 2016-08-09 DIAGNOSIS — M5442 Lumbago with sciatica, left side: Secondary | ICD-10-CM | POA: Diagnosis not present

## 2016-08-09 DIAGNOSIS — E119 Type 2 diabetes mellitus without complications: Secondary | ICD-10-CM | POA: Diagnosis not present

## 2016-08-11 DIAGNOSIS — G894 Chronic pain syndrome: Secondary | ICD-10-CM | POA: Diagnosis not present

## 2016-08-11 DIAGNOSIS — M545 Low back pain: Secondary | ICD-10-CM | POA: Diagnosis not present

## 2016-08-11 DIAGNOSIS — Z79891 Long term (current) use of opiate analgesic: Secondary | ICD-10-CM | POA: Diagnosis not present

## 2016-08-11 DIAGNOSIS — M5136 Other intervertebral disc degeneration, lumbar region: Secondary | ICD-10-CM | POA: Diagnosis not present

## 2016-08-11 DIAGNOSIS — M48062 Spinal stenosis, lumbar region with neurogenic claudication: Secondary | ICD-10-CM | POA: Diagnosis not present

## 2016-08-11 DIAGNOSIS — M4316 Spondylolisthesis, lumbar region: Secondary | ICD-10-CM | POA: Diagnosis not present

## 2016-08-11 DIAGNOSIS — Z79899 Other long term (current) drug therapy: Secondary | ICD-10-CM | POA: Diagnosis not present

## 2016-08-19 DIAGNOSIS — M48062 Spinal stenosis, lumbar region with neurogenic claudication: Secondary | ICD-10-CM | POA: Diagnosis not present

## 2016-08-19 DIAGNOSIS — M5136 Other intervertebral disc degeneration, lumbar region: Secondary | ICD-10-CM | POA: Diagnosis not present

## 2016-08-19 DIAGNOSIS — M4316 Spondylolisthesis, lumbar region: Secondary | ICD-10-CM | POA: Diagnosis not present

## 2016-08-19 DIAGNOSIS — M545 Low back pain: Secondary | ICD-10-CM | POA: Diagnosis not present

## 2016-08-30 DIAGNOSIS — M5136 Other intervertebral disc degeneration, lumbar region: Secondary | ICD-10-CM | POA: Diagnosis not present

## 2016-08-31 DIAGNOSIS — R079 Chest pain, unspecified: Secondary | ICD-10-CM | POA: Diagnosis not present

## 2016-08-31 DIAGNOSIS — R0789 Other chest pain: Secondary | ICD-10-CM | POA: Diagnosis not present

## 2016-09-26 DIAGNOSIS — K219 Gastro-esophageal reflux disease without esophagitis: Secondary | ICD-10-CM | POA: Diagnosis not present

## 2016-09-26 DIAGNOSIS — F329 Major depressive disorder, single episode, unspecified: Secondary | ICD-10-CM | POA: Diagnosis not present

## 2016-09-26 DIAGNOSIS — K7030 Alcoholic cirrhosis of liver without ascites: Secondary | ICD-10-CM | POA: Diagnosis not present

## 2016-09-26 DIAGNOSIS — E119 Type 2 diabetes mellitus without complications: Secondary | ICD-10-CM | POA: Diagnosis not present

## 2016-09-27 DIAGNOSIS — M4316 Spondylolisthesis, lumbar region: Secondary | ICD-10-CM | POA: Diagnosis not present

## 2016-09-27 DIAGNOSIS — M48062 Spinal stenosis, lumbar region with neurogenic claudication: Secondary | ICD-10-CM | POA: Diagnosis not present

## 2016-09-27 DIAGNOSIS — M5136 Other intervertebral disc degeneration, lumbar region: Secondary | ICD-10-CM | POA: Diagnosis not present

## 2016-09-29 DIAGNOSIS — I200 Unstable angina: Secondary | ICD-10-CM | POA: Diagnosis not present

## 2016-09-29 DIAGNOSIS — I2510 Atherosclerotic heart disease of native coronary artery without angina pectoris: Secondary | ICD-10-CM | POA: Diagnosis not present

## 2016-09-29 DIAGNOSIS — E785 Hyperlipidemia, unspecified: Secondary | ICD-10-CM | POA: Diagnosis not present

## 2016-09-29 DIAGNOSIS — E7800 Pure hypercholesterolemia, unspecified: Secondary | ICD-10-CM | POA: Diagnosis not present

## 2016-09-30 DIAGNOSIS — D649 Anemia, unspecified: Secondary | ICD-10-CM | POA: Diagnosis not present

## 2016-09-30 DIAGNOSIS — Z5309 Procedure and treatment not carried out because of other contraindication: Secondary | ICD-10-CM | POA: Diagnosis not present

## 2016-09-30 DIAGNOSIS — R079 Chest pain, unspecified: Secondary | ICD-10-CM | POA: Diagnosis not present

## 2016-09-30 DIAGNOSIS — I2510 Atherosclerotic heart disease of native coronary artery without angina pectoris: Secondary | ICD-10-CM | POA: Diagnosis not present

## 2016-10-08 DIAGNOSIS — M48061 Spinal stenosis, lumbar region without neurogenic claudication: Secondary | ICD-10-CM | POA: Diagnosis not present

## 2016-10-08 DIAGNOSIS — M545 Low back pain: Secondary | ICD-10-CM | POA: Diagnosis not present

## 2016-10-08 DIAGNOSIS — M5136 Other intervertebral disc degeneration, lumbar region: Secondary | ICD-10-CM | POA: Diagnosis not present

## 2016-10-17 DIAGNOSIS — D649 Anemia, unspecified: Secondary | ICD-10-CM | POA: Diagnosis not present

## 2016-10-17 DIAGNOSIS — Z6828 Body mass index (BMI) 28.0-28.9, adult: Secondary | ICD-10-CM | POA: Diagnosis not present

## 2016-10-17 DIAGNOSIS — D518 Other vitamin B12 deficiency anemias: Secondary | ICD-10-CM | POA: Diagnosis not present

## 2016-10-19 DIAGNOSIS — Z79899 Other long term (current) drug therapy: Secondary | ICD-10-CM | POA: Diagnosis not present

## 2016-10-19 DIAGNOSIS — G894 Chronic pain syndrome: Secondary | ICD-10-CM | POA: Diagnosis not present

## 2016-10-19 DIAGNOSIS — M545 Low back pain: Secondary | ICD-10-CM | POA: Diagnosis not present

## 2016-10-19 DIAGNOSIS — Z79891 Long term (current) use of opiate analgesic: Secondary | ICD-10-CM | POA: Diagnosis not present

## 2016-10-19 DIAGNOSIS — M5136 Other intervertebral disc degeneration, lumbar region: Secondary | ICD-10-CM | POA: Diagnosis not present

## 2016-10-26 DIAGNOSIS — J209 Acute bronchitis, unspecified: Secondary | ICD-10-CM | POA: Diagnosis not present

## 2016-10-27 DIAGNOSIS — Z7982 Long term (current) use of aspirin: Secondary | ICD-10-CM | POA: Diagnosis not present

## 2016-10-27 DIAGNOSIS — Z823 Family history of stroke: Secondary | ICD-10-CM | POA: Diagnosis not present

## 2016-10-27 DIAGNOSIS — Z955 Presence of coronary angioplasty implant and graft: Secondary | ICD-10-CM | POA: Diagnosis not present

## 2016-10-27 DIAGNOSIS — E119 Type 2 diabetes mellitus without complications: Secondary | ICD-10-CM | POA: Diagnosis not present

## 2016-10-27 DIAGNOSIS — F17210 Nicotine dependence, cigarettes, uncomplicated: Secondary | ICD-10-CM | POA: Diagnosis not present

## 2016-10-27 DIAGNOSIS — Z79899 Other long term (current) drug therapy: Secondary | ICD-10-CM | POA: Diagnosis not present

## 2016-10-27 DIAGNOSIS — K7460 Unspecified cirrhosis of liver: Secondary | ICD-10-CM | POA: Diagnosis not present

## 2016-10-27 DIAGNOSIS — Z7984 Long term (current) use of oral hypoglycemic drugs: Secondary | ICD-10-CM | POA: Diagnosis not present

## 2016-10-27 DIAGNOSIS — I10 Essential (primary) hypertension: Secondary | ICD-10-CM | POA: Diagnosis not present

## 2016-10-27 DIAGNOSIS — D509 Iron deficiency anemia, unspecified: Secondary | ICD-10-CM | POA: Diagnosis not present

## 2016-10-27 DIAGNOSIS — I252 Old myocardial infarction: Secondary | ICD-10-CM | POA: Diagnosis not present

## 2016-10-27 DIAGNOSIS — I2510 Atherosclerotic heart disease of native coronary artery without angina pectoris: Secondary | ICD-10-CM | POA: Diagnosis not present

## 2016-10-27 DIAGNOSIS — G4733 Obstructive sleep apnea (adult) (pediatric): Secondary | ICD-10-CM | POA: Diagnosis not present

## 2016-10-27 DIAGNOSIS — E785 Hyperlipidemia, unspecified: Secondary | ICD-10-CM | POA: Diagnosis not present

## 2016-10-27 DIAGNOSIS — I2582 Chronic total occlusion of coronary artery: Secondary | ICD-10-CM | POA: Diagnosis not present

## 2016-10-27 DIAGNOSIS — R079 Chest pain, unspecified: Secondary | ICD-10-CM | POA: Diagnosis not present

## 2016-10-27 DIAGNOSIS — I25118 Atherosclerotic heart disease of native coronary artery with other forms of angina pectoris: Secondary | ICD-10-CM | POA: Diagnosis not present

## 2016-10-27 DIAGNOSIS — J439 Emphysema, unspecified: Secondary | ICD-10-CM | POA: Diagnosis not present

## 2016-10-27 DIAGNOSIS — I214 Non-ST elevation (NSTEMI) myocardial infarction: Secondary | ICD-10-CM | POA: Diagnosis not present

## 2016-10-27 DIAGNOSIS — I25119 Atherosclerotic heart disease of native coronary artery with unspecified angina pectoris: Secondary | ICD-10-CM | POA: Diagnosis not present

## 2016-10-27 DIAGNOSIS — I517 Cardiomegaly: Secondary | ICD-10-CM | POA: Diagnosis not present

## 2016-10-27 DIAGNOSIS — G8929 Other chronic pain: Secondary | ICD-10-CM | POA: Diagnosis not present

## 2016-10-27 DIAGNOSIS — I501 Left ventricular failure: Secondary | ICD-10-CM | POA: Diagnosis not present

## 2016-10-28 DIAGNOSIS — I2582 Chronic total occlusion of coronary artery: Secondary | ICD-10-CM | POA: Diagnosis not present

## 2016-10-28 DIAGNOSIS — R9431 Abnormal electrocardiogram [ECG] [EKG]: Secondary | ICD-10-CM | POA: Diagnosis not present

## 2016-10-28 DIAGNOSIS — E119 Type 2 diabetes mellitus without complications: Secondary | ICD-10-CM | POA: Diagnosis not present

## 2016-10-28 DIAGNOSIS — D509 Iron deficiency anemia, unspecified: Secondary | ICD-10-CM | POA: Diagnosis not present

## 2016-10-28 DIAGNOSIS — I10 Essential (primary) hypertension: Secondary | ICD-10-CM | POA: Diagnosis not present

## 2016-10-28 DIAGNOSIS — I219 Acute myocardial infarction, unspecified: Secondary | ICD-10-CM | POA: Diagnosis not present

## 2016-10-28 DIAGNOSIS — I25119 Atherosclerotic heart disease of native coronary artery with unspecified angina pectoris: Secondary | ICD-10-CM | POA: Diagnosis not present

## 2016-10-28 DIAGNOSIS — I214 Non-ST elevation (NSTEMI) myocardial infarction: Secondary | ICD-10-CM | POA: Diagnosis not present

## 2016-11-01 DIAGNOSIS — I2510 Atherosclerotic heart disease of native coronary artery without angina pectoris: Secondary | ICD-10-CM | POA: Diagnosis not present

## 2016-11-01 DIAGNOSIS — F17210 Nicotine dependence, cigarettes, uncomplicated: Secondary | ICD-10-CM | POA: Diagnosis not present

## 2016-11-01 DIAGNOSIS — I10 Essential (primary) hypertension: Secondary | ICD-10-CM | POA: Diagnosis not present

## 2016-11-01 DIAGNOSIS — E785 Hyperlipidemia, unspecified: Secondary | ICD-10-CM | POA: Diagnosis not present

## 2016-11-01 DIAGNOSIS — D649 Anemia, unspecified: Secondary | ICD-10-CM | POA: Diagnosis not present

## 2016-11-09 DIAGNOSIS — Z720 Tobacco use: Secondary | ICD-10-CM | POA: Diagnosis not present

## 2016-11-09 DIAGNOSIS — J40 Bronchitis, not specified as acute or chronic: Secondary | ICD-10-CM | POA: Diagnosis not present

## 2016-11-09 DIAGNOSIS — Z6828 Body mass index (BMI) 28.0-28.9, adult: Secondary | ICD-10-CM | POA: Diagnosis not present

## 2016-11-25 DIAGNOSIS — M5136 Other intervertebral disc degeneration, lumbar region: Secondary | ICD-10-CM | POA: Diagnosis not present

## 2016-11-25 DIAGNOSIS — M545 Low back pain: Secondary | ICD-10-CM | POA: Diagnosis not present

## 2016-11-25 DIAGNOSIS — M419 Scoliosis, unspecified: Secondary | ICD-10-CM | POA: Diagnosis not present

## 2016-11-25 DIAGNOSIS — Z79899 Other long term (current) drug therapy: Secondary | ICD-10-CM | POA: Diagnosis not present

## 2016-11-25 DIAGNOSIS — Z79891 Long term (current) use of opiate analgesic: Secondary | ICD-10-CM | POA: Diagnosis not present

## 2016-11-25 DIAGNOSIS — G894 Chronic pain syndrome: Secondary | ICD-10-CM | POA: Diagnosis not present

## 2016-11-25 DIAGNOSIS — M5416 Radiculopathy, lumbar region: Secondary | ICD-10-CM | POA: Diagnosis not present

## 2016-12-23 DIAGNOSIS — M545 Low back pain: Secondary | ICD-10-CM | POA: Diagnosis not present

## 2016-12-23 DIAGNOSIS — M48062 Spinal stenosis, lumbar region with neurogenic claudication: Secondary | ICD-10-CM | POA: Diagnosis not present

## 2016-12-23 DIAGNOSIS — M5136 Other intervertebral disc degeneration, lumbar region: Secondary | ICD-10-CM | POA: Diagnosis not present

## 2016-12-23 DIAGNOSIS — M419 Scoliosis, unspecified: Secondary | ICD-10-CM | POA: Diagnosis not present

## 2017-01-19 DIAGNOSIS — Z79891 Long term (current) use of opiate analgesic: Secondary | ICD-10-CM | POA: Diagnosis not present

## 2017-01-19 DIAGNOSIS — I10 Essential (primary) hypertension: Secondary | ICD-10-CM | POA: Diagnosis not present

## 2017-01-19 DIAGNOSIS — Z6827 Body mass index (BMI) 27.0-27.9, adult: Secondary | ICD-10-CM | POA: Diagnosis not present

## 2017-01-19 DIAGNOSIS — Z79899 Other long term (current) drug therapy: Secondary | ICD-10-CM | POA: Diagnosis not present

## 2017-01-19 DIAGNOSIS — M419 Scoliosis, unspecified: Secondary | ICD-10-CM | POA: Diagnosis not present

## 2017-01-19 DIAGNOSIS — G894 Chronic pain syndrome: Secondary | ICD-10-CM | POA: Diagnosis not present

## 2017-02-09 DIAGNOSIS — Z7901 Long term (current) use of anticoagulants: Secondary | ICD-10-CM | POA: Diagnosis not present

## 2017-02-09 DIAGNOSIS — D508 Other iron deficiency anemias: Secondary | ICD-10-CM | POA: Diagnosis not present

## 2017-02-09 DIAGNOSIS — Z7982 Long term (current) use of aspirin: Secondary | ICD-10-CM | POA: Diagnosis not present

## 2017-02-09 DIAGNOSIS — I25118 Atherosclerotic heart disease of native coronary artery with other forms of angina pectoris: Secondary | ICD-10-CM | POA: Diagnosis not present

## 2017-02-09 DIAGNOSIS — I209 Angina pectoris, unspecified: Secondary | ICD-10-CM | POA: Diagnosis not present

## 2017-02-16 DIAGNOSIS — M419 Scoliosis, unspecified: Secondary | ICD-10-CM | POA: Diagnosis not present

## 2017-02-16 DIAGNOSIS — M545 Low back pain: Secondary | ICD-10-CM | POA: Diagnosis not present

## 2017-02-16 DIAGNOSIS — M5416 Radiculopathy, lumbar region: Secondary | ICD-10-CM | POA: Diagnosis not present

## 2017-02-16 DIAGNOSIS — M48062 Spinal stenosis, lumbar region with neurogenic claudication: Secondary | ICD-10-CM | POA: Diagnosis not present

## 2017-02-16 DIAGNOSIS — M7552 Bursitis of left shoulder: Secondary | ICD-10-CM | POA: Diagnosis not present

## 2017-03-17 DIAGNOSIS — M419 Scoliosis, unspecified: Secondary | ICD-10-CM | POA: Diagnosis not present

## 2017-03-17 DIAGNOSIS — M48062 Spinal stenosis, lumbar region with neurogenic claudication: Secondary | ICD-10-CM | POA: Diagnosis not present

## 2017-03-17 DIAGNOSIS — M5136 Other intervertebral disc degeneration, lumbar region: Secondary | ICD-10-CM | POA: Diagnosis not present

## 2017-03-17 DIAGNOSIS — M545 Low back pain: Secondary | ICD-10-CM | POA: Diagnosis not present

## 2017-04-21 DIAGNOSIS — M5136 Other intervertebral disc degeneration, lumbar region: Secondary | ICD-10-CM | POA: Diagnosis not present

## 2017-04-21 DIAGNOSIS — M48062 Spinal stenosis, lumbar region with neurogenic claudication: Secondary | ICD-10-CM | POA: Diagnosis not present

## 2017-04-21 DIAGNOSIS — G894 Chronic pain syndrome: Secondary | ICD-10-CM | POA: Diagnosis not present

## 2017-04-21 DIAGNOSIS — M545 Low back pain: Secondary | ICD-10-CM | POA: Diagnosis not present

## 2017-04-28 DIAGNOSIS — S20211A Contusion of right front wall of thorax, initial encounter: Secondary | ICD-10-CM | POA: Diagnosis not present

## 2017-05-04 DIAGNOSIS — E119 Type 2 diabetes mellitus without complications: Secondary | ICD-10-CM | POA: Diagnosis not present

## 2017-05-04 DIAGNOSIS — Z125 Encounter for screening for malignant neoplasm of prostate: Secondary | ICD-10-CM | POA: Diagnosis not present

## 2017-05-04 DIAGNOSIS — Z0000 Encounter for general adult medical examination without abnormal findings: Secondary | ICD-10-CM | POA: Diagnosis not present

## 2017-05-04 DIAGNOSIS — D649 Anemia, unspecified: Secondary | ICD-10-CM | POA: Diagnosis not present

## 2017-05-04 DIAGNOSIS — F329 Major depressive disorder, single episode, unspecified: Secondary | ICD-10-CM | POA: Diagnosis not present

## 2017-05-04 DIAGNOSIS — Z720 Tobacco use: Secondary | ICD-10-CM | POA: Diagnosis not present

## 2017-05-25 DIAGNOSIS — M48062 Spinal stenosis, lumbar region with neurogenic claudication: Secondary | ICD-10-CM | POA: Diagnosis not present

## 2017-05-25 DIAGNOSIS — M25552 Pain in left hip: Secondary | ICD-10-CM | POA: Diagnosis not present

## 2017-05-25 DIAGNOSIS — M4316 Spondylolisthesis, lumbar region: Secondary | ICD-10-CM | POA: Diagnosis not present

## 2017-06-01 DIAGNOSIS — Z6829 Body mass index (BMI) 29.0-29.9, adult: Secondary | ICD-10-CM | POA: Diagnosis not present

## 2017-06-01 DIAGNOSIS — Z720 Tobacco use: Secondary | ICD-10-CM | POA: Diagnosis not present

## 2017-06-01 DIAGNOSIS — F329 Major depressive disorder, single episode, unspecified: Secondary | ICD-10-CM | POA: Diagnosis not present

## 2017-06-29 DIAGNOSIS — M48062 Spinal stenosis, lumbar region with neurogenic claudication: Secondary | ICD-10-CM | POA: Diagnosis not present

## 2017-06-29 DIAGNOSIS — M5136 Other intervertebral disc degeneration, lumbar region: Secondary | ICD-10-CM | POA: Diagnosis not present

## 2017-06-29 DIAGNOSIS — M545 Low back pain: Secondary | ICD-10-CM | POA: Diagnosis not present

## 2017-06-29 DIAGNOSIS — M4316 Spondylolisthesis, lumbar region: Secondary | ICD-10-CM | POA: Diagnosis not present

## 2017-06-30 DIAGNOSIS — N400 Enlarged prostate without lower urinary tract symptoms: Secondary | ICD-10-CM | POA: Diagnosis present

## 2017-06-30 DIAGNOSIS — Z7982 Long term (current) use of aspirin: Secondary | ICD-10-CM | POA: Diagnosis not present

## 2017-06-30 DIAGNOSIS — E119 Type 2 diabetes mellitus without complications: Secondary | ICD-10-CM | POA: Diagnosis not present

## 2017-06-30 DIAGNOSIS — I712 Thoracic aortic aneurysm, without rupture: Secondary | ICD-10-CM | POA: Diagnosis present

## 2017-06-30 DIAGNOSIS — D72819 Decreased white blood cell count, unspecified: Secondary | ICD-10-CM | POA: Diagnosis not present

## 2017-06-30 DIAGNOSIS — R0602 Shortness of breath: Secondary | ICD-10-CM | POA: Diagnosis not present

## 2017-06-30 DIAGNOSIS — E785 Hyperlipidemia, unspecified: Secondary | ICD-10-CM | POA: Diagnosis not present

## 2017-06-30 DIAGNOSIS — R0981 Nasal congestion: Secondary | ICD-10-CM | POA: Diagnosis not present

## 2017-06-30 DIAGNOSIS — K449 Diaphragmatic hernia without obstruction or gangrene: Secondary | ICD-10-CM | POA: Diagnosis present

## 2017-06-30 DIAGNOSIS — F17200 Nicotine dependence, unspecified, uncomplicated: Secondary | ICD-10-CM | POA: Diagnosis present

## 2017-06-30 DIAGNOSIS — Z794 Long term (current) use of insulin: Secondary | ICD-10-CM | POA: Diagnosis not present

## 2017-06-30 DIAGNOSIS — R739 Hyperglycemia, unspecified: Secondary | ICD-10-CM | POA: Diagnosis not present

## 2017-06-30 DIAGNOSIS — E1165 Type 2 diabetes mellitus with hyperglycemia: Secondary | ICD-10-CM | POA: Diagnosis present

## 2017-06-30 DIAGNOSIS — J189 Pneumonia, unspecified organism: Secondary | ICD-10-CM | POA: Diagnosis not present

## 2017-06-30 DIAGNOSIS — Z7902 Long term (current) use of antithrombotics/antiplatelets: Secondary | ICD-10-CM | POA: Diagnosis not present

## 2017-06-30 DIAGNOSIS — R05 Cough: Secondary | ICD-10-CM | POA: Diagnosis not present

## 2017-06-30 DIAGNOSIS — Z955 Presence of coronary angioplasty implant and graft: Secondary | ICD-10-CM | POA: Diagnosis not present

## 2017-06-30 DIAGNOSIS — J9601 Acute respiratory failure with hypoxia: Secondary | ICD-10-CM | POA: Diagnosis not present

## 2017-06-30 DIAGNOSIS — I2510 Atherosclerotic heart disease of native coronary artery without angina pectoris: Secondary | ICD-10-CM | POA: Diagnosis present

## 2017-06-30 DIAGNOSIS — F329 Major depressive disorder, single episode, unspecified: Secondary | ICD-10-CM | POA: Diagnosis present

## 2017-06-30 DIAGNOSIS — G8929 Other chronic pain: Secondary | ICD-10-CM | POA: Diagnosis present

## 2017-06-30 DIAGNOSIS — K219 Gastro-esophageal reflux disease without esophagitis: Secondary | ICD-10-CM | POA: Diagnosis not present

## 2017-06-30 DIAGNOSIS — I714 Abdominal aortic aneurysm, without rupture: Secondary | ICD-10-CM | POA: Diagnosis present

## 2017-06-30 DIAGNOSIS — Z716 Tobacco abuse counseling: Secondary | ICD-10-CM | POA: Diagnosis not present

## 2017-06-30 DIAGNOSIS — A419 Sepsis, unspecified organism: Secondary | ICD-10-CM | POA: Diagnosis not present

## 2017-06-30 DIAGNOSIS — J440 Chronic obstructive pulmonary disease with acute lower respiratory infection: Secondary | ICD-10-CM | POA: Diagnosis present

## 2017-06-30 DIAGNOSIS — J441 Chronic obstructive pulmonary disease with (acute) exacerbation: Secondary | ICD-10-CM | POA: Diagnosis not present

## 2017-06-30 DIAGNOSIS — D62 Acute posthemorrhagic anemia: Secondary | ICD-10-CM | POA: Diagnosis not present

## 2017-07-06 DIAGNOSIS — I252 Old myocardial infarction: Secondary | ICD-10-CM | POA: Diagnosis not present

## 2017-07-06 DIAGNOSIS — D508 Other iron deficiency anemias: Secondary | ICD-10-CM | POA: Diagnosis not present

## 2017-07-06 DIAGNOSIS — I25118 Atherosclerotic heart disease of native coronary artery with other forms of angina pectoris: Secondary | ICD-10-CM | POA: Diagnosis not present

## 2017-07-06 DIAGNOSIS — E785 Hyperlipidemia, unspecified: Secondary | ICD-10-CM | POA: Diagnosis not present

## 2017-07-18 DIAGNOSIS — Z01818 Encounter for other preprocedural examination: Secondary | ICD-10-CM | POA: Diagnosis not present

## 2017-07-18 DIAGNOSIS — Z720 Tobacco use: Secondary | ICD-10-CM | POA: Diagnosis not present

## 2017-07-18 DIAGNOSIS — Z6826 Body mass index (BMI) 26.0-26.9, adult: Secondary | ICD-10-CM | POA: Diagnosis not present

## 2017-07-18 DIAGNOSIS — J441 Chronic obstructive pulmonary disease with (acute) exacerbation: Secondary | ICD-10-CM | POA: Diagnosis not present

## 2017-07-21 DIAGNOSIS — M545 Low back pain: Secondary | ICD-10-CM | POA: Diagnosis not present

## 2017-07-21 DIAGNOSIS — M961 Postlaminectomy syndrome, not elsewhere classified: Secondary | ICD-10-CM | POA: Diagnosis not present

## 2017-07-21 DIAGNOSIS — M4716 Other spondylosis with myelopathy, lumbar region: Secondary | ICD-10-CM | POA: Diagnosis not present

## 2017-07-23 DIAGNOSIS — R0602 Shortness of breath: Secondary | ICD-10-CM | POA: Diagnosis not present

## 2017-07-23 DIAGNOSIS — J189 Pneumonia, unspecified organism: Secondary | ICD-10-CM | POA: Diagnosis not present

## 2017-07-26 DIAGNOSIS — Z6826 Body mass index (BMI) 26.0-26.9, adult: Secondary | ICD-10-CM | POA: Diagnosis not present

## 2017-07-26 DIAGNOSIS — J441 Chronic obstructive pulmonary disease with (acute) exacerbation: Secondary | ICD-10-CM | POA: Diagnosis not present

## 2017-08-01 DIAGNOSIS — M4716 Other spondylosis with myelopathy, lumbar region: Secondary | ICD-10-CM | POA: Diagnosis not present

## 2017-08-01 DIAGNOSIS — Z01818 Encounter for other preprocedural examination: Secondary | ICD-10-CM | POA: Diagnosis not present

## 2017-08-01 DIAGNOSIS — M961 Postlaminectomy syndrome, not elsewhere classified: Secondary | ICD-10-CM | POA: Diagnosis not present

## 2017-08-01 DIAGNOSIS — M546 Pain in thoracic spine: Secondary | ICD-10-CM | POA: Diagnosis not present

## 2017-08-01 DIAGNOSIS — I517 Cardiomegaly: Secondary | ICD-10-CM | POA: Diagnosis not present

## 2017-08-07 DIAGNOSIS — Z981 Arthrodesis status: Secondary | ICD-10-CM | POA: Diagnosis not present

## 2017-08-07 DIAGNOSIS — A419 Sepsis, unspecified organism: Secondary | ICD-10-CM | POA: Diagnosis not present

## 2017-08-07 DIAGNOSIS — Z955 Presence of coronary angioplasty implant and graft: Secondary | ICD-10-CM | POA: Diagnosis not present

## 2017-08-07 DIAGNOSIS — D649 Anemia, unspecified: Secondary | ICD-10-CM | POA: Diagnosis present

## 2017-08-07 DIAGNOSIS — M48062 Spinal stenosis, lumbar region with neurogenic claudication: Secondary | ICD-10-CM | POA: Diagnosis not present

## 2017-08-07 DIAGNOSIS — I252 Old myocardial infarction: Secondary | ICD-10-CM | POA: Diagnosis not present

## 2017-08-07 DIAGNOSIS — F329 Major depressive disorder, single episode, unspecified: Secondary | ICD-10-CM | POA: Diagnosis present

## 2017-08-07 DIAGNOSIS — R509 Fever, unspecified: Secondary | ICD-10-CM | POA: Diagnosis not present

## 2017-08-07 DIAGNOSIS — K449 Diaphragmatic hernia without obstruction or gangrene: Secondary | ICD-10-CM | POA: Diagnosis present

## 2017-08-07 DIAGNOSIS — M545 Low back pain: Secondary | ICD-10-CM | POA: Diagnosis present

## 2017-08-07 DIAGNOSIS — Z7984 Long term (current) use of oral hypoglycemic drugs: Secondary | ICD-10-CM | POA: Diagnosis not present

## 2017-08-07 DIAGNOSIS — I10 Essential (primary) hypertension: Secondary | ICD-10-CM | POA: Diagnosis present

## 2017-08-07 DIAGNOSIS — M4726 Other spondylosis with radiculopathy, lumbar region: Secondary | ICD-10-CM | POA: Diagnosis not present

## 2017-08-07 DIAGNOSIS — M4716 Other spondylosis with myelopathy, lumbar region: Secondary | ICD-10-CM | POA: Diagnosis not present

## 2017-08-07 DIAGNOSIS — M961 Postlaminectomy syndrome, not elsewhere classified: Secondary | ICD-10-CM | POA: Diagnosis not present

## 2017-08-07 DIAGNOSIS — F69 Unspecified disorder of adult personality and behavior: Secondary | ICD-10-CM | POA: Diagnosis not present

## 2017-08-07 DIAGNOSIS — M4316 Spondylolisthesis, lumbar region: Secondary | ICD-10-CM | POA: Diagnosis not present

## 2017-08-07 DIAGNOSIS — Z87442 Personal history of urinary calculi: Secondary | ICD-10-CM | POA: Diagnosis not present

## 2017-08-07 DIAGNOSIS — Z7982 Long term (current) use of aspirin: Secondary | ICD-10-CM | POA: Diagnosis not present

## 2017-08-07 DIAGNOSIS — R410 Disorientation, unspecified: Secondary | ICD-10-CM | POA: Diagnosis not present

## 2017-08-07 DIAGNOSIS — J449 Chronic obstructive pulmonary disease, unspecified: Secondary | ICD-10-CM | POA: Diagnosis not present

## 2017-08-07 DIAGNOSIS — E785 Hyperlipidemia, unspecified: Secondary | ICD-10-CM | POA: Diagnosis present

## 2017-08-07 DIAGNOSIS — K8020 Calculus of gallbladder without cholecystitis without obstruction: Secondary | ICD-10-CM | POA: Diagnosis not present

## 2017-08-07 DIAGNOSIS — Z9119 Patient's noncompliance with other medical treatment and regimen: Secondary | ICD-10-CM | POA: Diagnosis not present

## 2017-08-07 DIAGNOSIS — G4733 Obstructive sleep apnea (adult) (pediatric): Secondary | ICD-10-CM | POA: Diagnosis present

## 2017-08-07 DIAGNOSIS — F419 Anxiety disorder, unspecified: Secondary | ICD-10-CM | POA: Diagnosis present

## 2017-08-07 DIAGNOSIS — F17210 Nicotine dependence, cigarettes, uncomplicated: Secondary | ICD-10-CM | POA: Diagnosis present

## 2017-08-07 DIAGNOSIS — G709 Myoneural disorder, unspecified: Secondary | ICD-10-CM | POA: Diagnosis present

## 2017-08-07 DIAGNOSIS — K219 Gastro-esophageal reflux disease without esophagitis: Secondary | ICD-10-CM | POA: Diagnosis present

## 2017-08-07 DIAGNOSIS — R0602 Shortness of breath: Secondary | ICD-10-CM | POA: Diagnosis not present

## 2017-08-07 DIAGNOSIS — I25119 Atherosclerotic heart disease of native coronary artery with unspecified angina pectoris: Secondary | ICD-10-CM | POA: Diagnosis present

## 2017-08-07 DIAGNOSIS — R4182 Altered mental status, unspecified: Secondary | ICD-10-CM | POA: Diagnosis not present

## 2017-08-07 DIAGNOSIS — Z7902 Long term (current) use of antithrombotics/antiplatelets: Secondary | ICD-10-CM | POA: Diagnosis not present

## 2017-08-07 DIAGNOSIS — E119 Type 2 diabetes mellitus without complications: Secondary | ICD-10-CM | POA: Diagnosis present

## 2017-08-07 DIAGNOSIS — J9601 Acute respiratory failure with hypoxia: Secondary | ICD-10-CM | POA: Diagnosis not present

## 2017-08-07 DIAGNOSIS — M4326 Fusion of spine, lumbar region: Secondary | ICD-10-CM | POA: Diagnosis not present

## 2017-08-08 DIAGNOSIS — K8020 Calculus of gallbladder without cholecystitis without obstruction: Secondary | ICD-10-CM | POA: Diagnosis not present

## 2017-08-08 DIAGNOSIS — R4182 Altered mental status, unspecified: Secondary | ICD-10-CM | POA: Diagnosis not present

## 2017-08-08 DIAGNOSIS — R0602 Shortness of breath: Secondary | ICD-10-CM | POA: Diagnosis not present

## 2017-08-08 DIAGNOSIS — J449 Chronic obstructive pulmonary disease, unspecified: Secondary | ICD-10-CM | POA: Diagnosis not present

## 2017-08-08 DIAGNOSIS — A419 Sepsis, unspecified organism: Secondary | ICD-10-CM | POA: Diagnosis not present

## 2017-08-08 DIAGNOSIS — R410 Disorientation, unspecified: Secondary | ICD-10-CM | POA: Diagnosis not present

## 2017-08-08 DIAGNOSIS — F17210 Nicotine dependence, cigarettes, uncomplicated: Secondary | ICD-10-CM | POA: Diagnosis not present

## 2017-08-08 DIAGNOSIS — R509 Fever, unspecified: Secondary | ICD-10-CM | POA: Diagnosis not present

## 2017-08-08 DIAGNOSIS — J9601 Acute respiratory failure with hypoxia: Secondary | ICD-10-CM | POA: Diagnosis not present

## 2017-08-09 DIAGNOSIS — A419 Sepsis, unspecified organism: Secondary | ICD-10-CM | POA: Diagnosis not present

## 2017-08-10 DIAGNOSIS — E785 Hyperlipidemia, unspecified: Secondary | ICD-10-CM | POA: Diagnosis not present

## 2017-08-10 DIAGNOSIS — F419 Anxiety disorder, unspecified: Secondary | ICD-10-CM | POA: Diagnosis not present

## 2017-08-10 DIAGNOSIS — I25118 Atherosclerotic heart disease of native coronary artery with other forms of angina pectoris: Secondary | ICD-10-CM | POA: Diagnosis not present

## 2017-08-10 DIAGNOSIS — F329 Major depressive disorder, single episode, unspecified: Secondary | ICD-10-CM | POA: Diagnosis not present

## 2017-08-10 DIAGNOSIS — D509 Iron deficiency anemia, unspecified: Secondary | ICD-10-CM | POA: Diagnosis not present

## 2017-08-10 DIAGNOSIS — E119 Type 2 diabetes mellitus without complications: Secondary | ICD-10-CM | POA: Diagnosis not present

## 2017-08-10 DIAGNOSIS — K219 Gastro-esophageal reflux disease without esophagitis: Secondary | ICD-10-CM | POA: Diagnosis not present

## 2017-08-10 DIAGNOSIS — R6510 Systemic inflammatory response syndrome (SIRS) of non-infectious origin without acute organ dysfunction: Secondary | ICD-10-CM | POA: Diagnosis not present

## 2017-08-10 DIAGNOSIS — E876 Hypokalemia: Secondary | ICD-10-CM | POA: Diagnosis not present

## 2017-08-10 DIAGNOSIS — G4733 Obstructive sleep apnea (adult) (pediatric): Secondary | ICD-10-CM | POA: Diagnosis not present

## 2017-08-10 DIAGNOSIS — G9340 Encephalopathy, unspecified: Secondary | ICD-10-CM | POA: Diagnosis not present

## 2017-08-11 DIAGNOSIS — F419 Anxiety disorder, unspecified: Secondary | ICD-10-CM | POA: Diagnosis not present

## 2017-08-11 DIAGNOSIS — E876 Hypokalemia: Secondary | ICD-10-CM | POA: Diagnosis not present

## 2017-08-11 DIAGNOSIS — K219 Gastro-esophageal reflux disease without esophagitis: Secondary | ICD-10-CM | POA: Diagnosis not present

## 2017-08-11 DIAGNOSIS — R6510 Systemic inflammatory response syndrome (SIRS) of non-infectious origin without acute organ dysfunction: Secondary | ICD-10-CM | POA: Diagnosis not present

## 2017-08-11 DIAGNOSIS — I2510 Atherosclerotic heart disease of native coronary artery without angina pectoris: Secondary | ICD-10-CM | POA: Diagnosis not present

## 2017-08-11 DIAGNOSIS — E785 Hyperlipidemia, unspecified: Secondary | ICD-10-CM | POA: Diagnosis not present

## 2017-08-11 DIAGNOSIS — E119 Type 2 diabetes mellitus without complications: Secondary | ICD-10-CM | POA: Diagnosis not present

## 2017-08-11 DIAGNOSIS — D509 Iron deficiency anemia, unspecified: Secondary | ICD-10-CM | POA: Diagnosis not present

## 2017-08-11 DIAGNOSIS — I25118 Atherosclerotic heart disease of native coronary artery with other forms of angina pectoris: Secondary | ICD-10-CM | POA: Diagnosis not present

## 2017-08-11 DIAGNOSIS — F329 Major depressive disorder, single episode, unspecified: Secondary | ICD-10-CM | POA: Diagnosis not present

## 2017-08-12 DIAGNOSIS — E785 Hyperlipidemia, unspecified: Secondary | ICD-10-CM | POA: Diagnosis not present

## 2017-08-12 DIAGNOSIS — K219 Gastro-esophageal reflux disease without esophagitis: Secondary | ICD-10-CM | POA: Diagnosis not present

## 2017-08-12 DIAGNOSIS — F419 Anxiety disorder, unspecified: Secondary | ICD-10-CM | POA: Diagnosis not present

## 2017-08-12 DIAGNOSIS — I25118 Atherosclerotic heart disease of native coronary artery with other forms of angina pectoris: Secondary | ICD-10-CM | POA: Diagnosis not present

## 2017-08-12 DIAGNOSIS — E876 Hypokalemia: Secondary | ICD-10-CM | POA: Diagnosis not present

## 2017-08-12 DIAGNOSIS — D509 Iron deficiency anemia, unspecified: Secondary | ICD-10-CM | POA: Diagnosis not present

## 2017-08-12 DIAGNOSIS — F329 Major depressive disorder, single episode, unspecified: Secondary | ICD-10-CM | POA: Diagnosis not present

## 2017-08-12 DIAGNOSIS — E119 Type 2 diabetes mellitus without complications: Secondary | ICD-10-CM | POA: Diagnosis not present

## 2017-08-12 DIAGNOSIS — R6510 Systemic inflammatory response syndrome (SIRS) of non-infectious origin without acute organ dysfunction: Secondary | ICD-10-CM | POA: Diagnosis not present

## 2017-08-12 DIAGNOSIS — I2510 Atherosclerotic heart disease of native coronary artery without angina pectoris: Secondary | ICD-10-CM | POA: Diagnosis not present

## 2017-08-28 DIAGNOSIS — E119 Type 2 diabetes mellitus without complications: Secondary | ICD-10-CM | POA: Diagnosis not present

## 2017-09-04 DIAGNOSIS — M4716 Other spondylosis with myelopathy, lumbar region: Secondary | ICD-10-CM | POA: Diagnosis not present

## 2017-10-03 DIAGNOSIS — Z79891 Long term (current) use of opiate analgesic: Secondary | ICD-10-CM | POA: Diagnosis not present

## 2017-10-03 DIAGNOSIS — Z79899 Other long term (current) drug therapy: Secondary | ICD-10-CM | POA: Diagnosis not present

## 2017-10-03 DIAGNOSIS — G894 Chronic pain syndrome: Secondary | ICD-10-CM | POA: Diagnosis not present

## 2017-10-11 DIAGNOSIS — J4520 Mild intermittent asthma, uncomplicated: Secondary | ICD-10-CM | POA: Diagnosis not present

## 2017-10-11 DIAGNOSIS — E559 Vitamin D deficiency, unspecified: Secondary | ICD-10-CM | POA: Diagnosis not present

## 2017-10-11 DIAGNOSIS — G4733 Obstructive sleep apnea (adult) (pediatric): Secondary | ICD-10-CM | POA: Diagnosis not present

## 2017-10-11 DIAGNOSIS — G2581 Restless legs syndrome: Secondary | ICD-10-CM | POA: Diagnosis not present

## 2017-10-11 DIAGNOSIS — R5383 Other fatigue: Secondary | ICD-10-CM | POA: Diagnosis not present

## 2017-11-01 DIAGNOSIS — M4326 Fusion of spine, lumbar region: Secondary | ICD-10-CM | POA: Diagnosis not present

## 2017-11-08 DIAGNOSIS — G4733 Obstructive sleep apnea (adult) (pediatric): Secondary | ICD-10-CM | POA: Diagnosis not present

## 2017-11-13 DIAGNOSIS — M545 Low back pain: Secondary | ICD-10-CM | POA: Diagnosis not present

## 2017-11-14 DIAGNOSIS — J4520 Mild intermittent asthma, uncomplicated: Secondary | ICD-10-CM | POA: Diagnosis not present

## 2017-11-14 DIAGNOSIS — E559 Vitamin D deficiency, unspecified: Secondary | ICD-10-CM | POA: Diagnosis not present

## 2017-11-14 DIAGNOSIS — G2581 Restless legs syndrome: Secondary | ICD-10-CM | POA: Diagnosis not present

## 2017-11-14 DIAGNOSIS — G4733 Obstructive sleep apnea (adult) (pediatric): Secondary | ICD-10-CM | POA: Diagnosis not present

## 2017-11-14 DIAGNOSIS — R5383 Other fatigue: Secondary | ICD-10-CM | POA: Diagnosis not present

## 2017-11-15 DIAGNOSIS — M545 Low back pain: Secondary | ICD-10-CM | POA: Diagnosis not present

## 2017-11-21 DIAGNOSIS — M545 Low back pain: Secondary | ICD-10-CM | POA: Diagnosis not present

## 2017-11-23 DIAGNOSIS — M545 Low back pain: Secondary | ICD-10-CM | POA: Diagnosis not present

## 2017-11-28 DIAGNOSIS — M545 Low back pain: Secondary | ICD-10-CM | POA: Diagnosis not present

## 2017-11-30 DIAGNOSIS — M545 Low back pain: Secondary | ICD-10-CM | POA: Diagnosis not present

## 2017-12-01 DIAGNOSIS — G4733 Obstructive sleep apnea (adult) (pediatric): Secondary | ICD-10-CM | POA: Diagnosis not present

## 2017-12-05 DIAGNOSIS — M545 Low back pain: Secondary | ICD-10-CM | POA: Diagnosis not present

## 2017-12-05 DIAGNOSIS — Z79899 Other long term (current) drug therapy: Secondary | ICD-10-CM | POA: Diagnosis not present

## 2017-12-05 DIAGNOSIS — G894 Chronic pain syndrome: Secondary | ICD-10-CM | POA: Diagnosis not present

## 2017-12-05 DIAGNOSIS — Z79891 Long term (current) use of opiate analgesic: Secondary | ICD-10-CM | POA: Diagnosis not present

## 2017-12-05 DIAGNOSIS — M4326 Fusion of spine, lumbar region: Secondary | ICD-10-CM | POA: Diagnosis not present

## 2017-12-06 DIAGNOSIS — G4737 Central sleep apnea in conditions classified elsewhere: Secondary | ICD-10-CM | POA: Diagnosis not present

## 2017-12-06 DIAGNOSIS — G4733 Obstructive sleep apnea (adult) (pediatric): Secondary | ICD-10-CM | POA: Diagnosis not present

## 2017-12-06 DIAGNOSIS — G2581 Restless legs syndrome: Secondary | ICD-10-CM | POA: Diagnosis not present

## 2017-12-06 DIAGNOSIS — J4520 Mild intermittent asthma, uncomplicated: Secondary | ICD-10-CM | POA: Diagnosis not present

## 2017-12-06 DIAGNOSIS — R5383 Other fatigue: Secondary | ICD-10-CM | POA: Diagnosis not present

## 2017-12-13 DIAGNOSIS — L570 Actinic keratosis: Secondary | ICD-10-CM | POA: Diagnosis not present

## 2017-12-13 DIAGNOSIS — L821 Other seborrheic keratosis: Secondary | ICD-10-CM | POA: Diagnosis not present

## 2017-12-13 DIAGNOSIS — L578 Other skin changes due to chronic exposure to nonionizing radiation: Secondary | ICD-10-CM | POA: Diagnosis not present

## 2017-12-13 DIAGNOSIS — L820 Inflamed seborrheic keratosis: Secondary | ICD-10-CM | POA: Diagnosis not present

## 2017-12-22 DIAGNOSIS — M545 Low back pain: Secondary | ICD-10-CM | POA: Diagnosis not present

## 2017-12-29 DIAGNOSIS — J40 Bronchitis, not specified as acute or chronic: Secondary | ICD-10-CM | POA: Diagnosis not present

## 2017-12-29 DIAGNOSIS — E119 Type 2 diabetes mellitus without complications: Secondary | ICD-10-CM | POA: Diagnosis not present

## 2017-12-29 DIAGNOSIS — J329 Chronic sinusitis, unspecified: Secondary | ICD-10-CM | POA: Diagnosis not present

## 2017-12-29 DIAGNOSIS — Z23 Encounter for immunization: Secondary | ICD-10-CM | POA: Diagnosis not present

## 2017-12-29 DIAGNOSIS — D509 Iron deficiency anemia, unspecified: Secondary | ICD-10-CM | POA: Diagnosis not present

## 2018-01-01 DIAGNOSIS — M545 Low back pain: Secondary | ICD-10-CM | POA: Diagnosis not present

## 2018-01-01 DIAGNOSIS — M4326 Fusion of spine, lumbar region: Secondary | ICD-10-CM | POA: Diagnosis not present

## 2018-01-12 DIAGNOSIS — I252 Old myocardial infarction: Secondary | ICD-10-CM | POA: Diagnosis not present

## 2018-01-12 DIAGNOSIS — I25118 Atherosclerotic heart disease of native coronary artery with other forms of angina pectoris: Secondary | ICD-10-CM | POA: Diagnosis not present

## 2018-01-12 DIAGNOSIS — R011 Cardiac murmur, unspecified: Secondary | ICD-10-CM | POA: Diagnosis not present

## 2018-01-12 DIAGNOSIS — D508 Other iron deficiency anemias: Secondary | ICD-10-CM | POA: Diagnosis not present

## 2018-01-12 DIAGNOSIS — E7849 Other hyperlipidemia: Secondary | ICD-10-CM | POA: Diagnosis not present

## 2018-01-14 DIAGNOSIS — G4733 Obstructive sleep apnea (adult) (pediatric): Secondary | ICD-10-CM | POA: Diagnosis not present

## 2018-01-17 DIAGNOSIS — G4733 Obstructive sleep apnea (adult) (pediatric): Secondary | ICD-10-CM | POA: Diagnosis not present

## 2018-01-17 DIAGNOSIS — R011 Cardiac murmur, unspecified: Secondary | ICD-10-CM | POA: Diagnosis not present

## 2018-01-17 DIAGNOSIS — G2581 Restless legs syndrome: Secondary | ICD-10-CM | POA: Diagnosis not present

## 2018-01-17 DIAGNOSIS — R5383 Other fatigue: Secondary | ICD-10-CM | POA: Diagnosis not present

## 2018-01-17 DIAGNOSIS — J4520 Mild intermittent asthma, uncomplicated: Secondary | ICD-10-CM | POA: Diagnosis not present

## 2018-01-17 DIAGNOSIS — G4737 Central sleep apnea in conditions classified elsewhere: Secondary | ICD-10-CM | POA: Diagnosis not present

## 2018-01-29 DIAGNOSIS — M545 Low back pain: Secondary | ICD-10-CM | POA: Diagnosis not present

## 2018-01-29 DIAGNOSIS — M4326 Fusion of spine, lumbar region: Secondary | ICD-10-CM | POA: Diagnosis not present

## 2018-03-05 DIAGNOSIS — M4326 Fusion of spine, lumbar region: Secondary | ICD-10-CM | POA: Diagnosis not present

## 2018-03-05 DIAGNOSIS — M545 Low back pain: Secondary | ICD-10-CM | POA: Diagnosis not present

## 2018-03-08 DIAGNOSIS — G4733 Obstructive sleep apnea (adult) (pediatric): Secondary | ICD-10-CM | POA: Diagnosis not present

## 2018-03-09 DIAGNOSIS — R5383 Other fatigue: Secondary | ICD-10-CM | POA: Diagnosis not present

## 2018-03-09 DIAGNOSIS — G2581 Restless legs syndrome: Secondary | ICD-10-CM | POA: Diagnosis not present

## 2018-03-09 DIAGNOSIS — J4520 Mild intermittent asthma, uncomplicated: Secondary | ICD-10-CM | POA: Diagnosis not present

## 2018-03-09 DIAGNOSIS — G4737 Central sleep apnea in conditions classified elsewhere: Secondary | ICD-10-CM | POA: Diagnosis not present

## 2018-03-09 DIAGNOSIS — G4733 Obstructive sleep apnea (adult) (pediatric): Secondary | ICD-10-CM | POA: Diagnosis not present

## 2018-03-14 DIAGNOSIS — E1165 Type 2 diabetes mellitus with hyperglycemia: Secondary | ICD-10-CM | POA: Diagnosis not present

## 2018-03-14 DIAGNOSIS — Z6828 Body mass index (BMI) 28.0-28.9, adult: Secondary | ICD-10-CM | POA: Diagnosis not present

## 2018-03-14 DIAGNOSIS — Z720 Tobacco use: Secondary | ICD-10-CM | POA: Diagnosis not present

## 2018-03-16 DIAGNOSIS — G4733 Obstructive sleep apnea (adult) (pediatric): Secondary | ICD-10-CM | POA: Diagnosis not present

## 2018-03-30 DIAGNOSIS — I252 Old myocardial infarction: Secondary | ICD-10-CM | POA: Diagnosis not present

## 2018-03-30 DIAGNOSIS — I340 Nonrheumatic mitral (valve) insufficiency: Secondary | ICD-10-CM | POA: Diagnosis not present

## 2018-03-30 DIAGNOSIS — I25118 Atherosclerotic heart disease of native coronary artery with other forms of angina pectoris: Secondary | ICD-10-CM | POA: Diagnosis not present

## 2018-03-30 DIAGNOSIS — R0602 Shortness of breath: Secondary | ICD-10-CM | POA: Diagnosis not present

## 2018-03-30 DIAGNOSIS — Z720 Tobacco use: Secondary | ICD-10-CM | POA: Diagnosis not present

## 2018-03-30 DIAGNOSIS — D509 Iron deficiency anemia, unspecified: Secondary | ICD-10-CM | POA: Diagnosis not present

## 2018-03-30 DIAGNOSIS — F17210 Nicotine dependence, cigarettes, uncomplicated: Secondary | ICD-10-CM | POA: Diagnosis not present

## 2018-03-30 DIAGNOSIS — E785 Hyperlipidemia, unspecified: Secondary | ICD-10-CM | POA: Diagnosis not present

## 2018-04-02 DIAGNOSIS — M545 Low back pain: Secondary | ICD-10-CM | POA: Diagnosis not present

## 2018-04-02 DIAGNOSIS — M4326 Fusion of spine, lumbar region: Secondary | ICD-10-CM | POA: Diagnosis not present

## 2018-04-03 DIAGNOSIS — I700 Atherosclerosis of aorta: Secondary | ICD-10-CM | POA: Diagnosis not present

## 2018-04-03 DIAGNOSIS — K449 Diaphragmatic hernia without obstruction or gangrene: Secondary | ICD-10-CM | POA: Diagnosis not present

## 2018-04-03 DIAGNOSIS — I25119 Atherosclerotic heart disease of native coronary artery with unspecified angina pectoris: Secondary | ICD-10-CM | POA: Diagnosis not present

## 2018-04-03 DIAGNOSIS — I25118 Atherosclerotic heart disease of native coronary artery with other forms of angina pectoris: Secondary | ICD-10-CM | POA: Diagnosis not present

## 2018-04-18 DIAGNOSIS — Z6828 Body mass index (BMI) 28.0-28.9, adult: Secondary | ICD-10-CM | POA: Diagnosis not present

## 2018-04-18 DIAGNOSIS — J441 Chronic obstructive pulmonary disease with (acute) exacerbation: Secondary | ICD-10-CM | POA: Diagnosis not present

## 2018-04-18 DIAGNOSIS — E1165 Type 2 diabetes mellitus with hyperglycemia: Secondary | ICD-10-CM | POA: Diagnosis not present

## 2018-04-20 DIAGNOSIS — J4520 Mild intermittent asthma, uncomplicated: Secondary | ICD-10-CM | POA: Diagnosis not present

## 2018-04-20 DIAGNOSIS — G2581 Restless legs syndrome: Secondary | ICD-10-CM | POA: Diagnosis not present

## 2018-04-20 DIAGNOSIS — G4733 Obstructive sleep apnea (adult) (pediatric): Secondary | ICD-10-CM | POA: Diagnosis not present

## 2018-04-30 DIAGNOSIS — M545 Low back pain: Secondary | ICD-10-CM | POA: Diagnosis not present

## 2018-04-30 DIAGNOSIS — M4326 Fusion of spine, lumbar region: Secondary | ICD-10-CM | POA: Diagnosis not present

## 2018-05-16 DIAGNOSIS — I340 Nonrheumatic mitral (valve) insufficiency: Secondary | ICD-10-CM | POA: Diagnosis not present

## 2018-05-16 DIAGNOSIS — I25118 Atherosclerotic heart disease of native coronary artery with other forms of angina pectoris: Secondary | ICD-10-CM | POA: Diagnosis not present

## 2018-05-16 DIAGNOSIS — I252 Old myocardial infarction: Secondary | ICD-10-CM | POA: Diagnosis not present

## 2018-05-16 DIAGNOSIS — E7800 Pure hypercholesterolemia, unspecified: Secondary | ICD-10-CM | POA: Diagnosis not present

## 2018-05-25 DIAGNOSIS — Z6827 Body mass index (BMI) 27.0-27.9, adult: Secondary | ICD-10-CM | POA: Diagnosis not present

## 2018-05-25 DIAGNOSIS — E1165 Type 2 diabetes mellitus with hyperglycemia: Secondary | ICD-10-CM | POA: Diagnosis not present

## 2018-05-28 DIAGNOSIS — G894 Chronic pain syndrome: Secondary | ICD-10-CM | POA: Diagnosis not present

## 2018-05-28 DIAGNOSIS — M4326 Fusion of spine, lumbar region: Secondary | ICD-10-CM | POA: Diagnosis not present

## 2018-05-28 DIAGNOSIS — M545 Low back pain: Secondary | ICD-10-CM | POA: Diagnosis not present

## 2018-06-08 DIAGNOSIS — E1165 Type 2 diabetes mellitus with hyperglycemia: Secondary | ICD-10-CM | POA: Diagnosis not present

## 2018-07-02 DIAGNOSIS — M545 Low back pain: Secondary | ICD-10-CM | POA: Diagnosis not present

## 2018-07-02 DIAGNOSIS — G894 Chronic pain syndrome: Secondary | ICD-10-CM | POA: Diagnosis not present

## 2018-07-02 DIAGNOSIS — M4326 Fusion of spine, lumbar region: Secondary | ICD-10-CM | POA: Diagnosis not present

## 2018-08-07 DIAGNOSIS — M4326 Fusion of spine, lumbar region: Secondary | ICD-10-CM | POA: Diagnosis not present

## 2018-08-07 DIAGNOSIS — G894 Chronic pain syndrome: Secondary | ICD-10-CM | POA: Diagnosis not present

## 2018-08-07 DIAGNOSIS — M545 Low back pain: Secondary | ICD-10-CM | POA: Diagnosis not present

## 2018-09-05 DIAGNOSIS — M545 Low back pain: Secondary | ICD-10-CM | POA: Diagnosis not present

## 2018-09-05 DIAGNOSIS — G894 Chronic pain syndrome: Secondary | ICD-10-CM | POA: Diagnosis not present

## 2018-09-05 DIAGNOSIS — M4326 Fusion of spine, lumbar region: Secondary | ICD-10-CM | POA: Diagnosis not present

## 2018-09-25 DIAGNOSIS — I252 Old myocardial infarction: Secondary | ICD-10-CM | POA: Diagnosis not present

## 2018-09-25 DIAGNOSIS — I10 Essential (primary) hypertension: Secondary | ICD-10-CM | POA: Diagnosis not present

## 2018-09-25 DIAGNOSIS — E7800 Pure hypercholesterolemia, unspecified: Secondary | ICD-10-CM | POA: Diagnosis not present

## 2018-09-25 DIAGNOSIS — I340 Nonrheumatic mitral (valve) insufficiency: Secondary | ICD-10-CM | POA: Diagnosis not present

## 2018-09-25 DIAGNOSIS — E119 Type 2 diabetes mellitus without complications: Secondary | ICD-10-CM | POA: Diagnosis not present

## 2018-09-25 DIAGNOSIS — I25118 Atherosclerotic heart disease of native coronary artery with other forms of angina pectoris: Secondary | ICD-10-CM | POA: Diagnosis not present

## 2018-09-25 DIAGNOSIS — Z720 Tobacco use: Secondary | ICD-10-CM | POA: Diagnosis not present

## 2018-10-03 DIAGNOSIS — M545 Low back pain: Secondary | ICD-10-CM | POA: Diagnosis not present

## 2018-10-03 DIAGNOSIS — G894 Chronic pain syndrome: Secondary | ICD-10-CM | POA: Diagnosis not present

## 2018-10-03 DIAGNOSIS — M4326 Fusion of spine, lumbar region: Secondary | ICD-10-CM | POA: Diagnosis not present

## 2018-10-05 ENCOUNTER — Other Ambulatory Visit: Payer: Self-pay

## 2018-10-09 DIAGNOSIS — E785 Hyperlipidemia, unspecified: Secondary | ICD-10-CM | POA: Diagnosis not present

## 2018-10-09 DIAGNOSIS — G4733 Obstructive sleep apnea (adult) (pediatric): Secondary | ICD-10-CM | POA: Diagnosis not present

## 2018-10-09 DIAGNOSIS — R079 Chest pain, unspecified: Secondary | ICD-10-CM | POA: Diagnosis not present

## 2018-10-09 DIAGNOSIS — I340 Nonrheumatic mitral (valve) insufficiency: Secondary | ICD-10-CM | POA: Diagnosis present

## 2018-10-09 DIAGNOSIS — I252 Old myocardial infarction: Secondary | ICD-10-CM | POA: Diagnosis not present

## 2018-10-09 DIAGNOSIS — Z955 Presence of coronary angioplasty implant and graft: Secondary | ICD-10-CM | POA: Diagnosis not present

## 2018-10-09 DIAGNOSIS — I2119 ST elevation (STEMI) myocardial infarction involving other coronary artery of inferior wall: Secondary | ICD-10-CM | POA: Diagnosis not present

## 2018-10-09 DIAGNOSIS — F17210 Nicotine dependence, cigarettes, uncomplicated: Secondary | ICD-10-CM | POA: Diagnosis not present

## 2018-10-09 DIAGNOSIS — E7800 Pure hypercholesterolemia, unspecified: Secondary | ICD-10-CM | POA: Diagnosis present

## 2018-10-09 DIAGNOSIS — R9431 Abnormal electrocardiogram [ECG] [EKG]: Secondary | ICD-10-CM | POA: Diagnosis not present

## 2018-10-09 DIAGNOSIS — I25119 Atherosclerotic heart disease of native coronary artery with unspecified angina pectoris: Secondary | ICD-10-CM | POA: Diagnosis present

## 2018-10-09 DIAGNOSIS — I25118 Atherosclerotic heart disease of native coronary artery with other forms of angina pectoris: Secondary | ICD-10-CM | POA: Diagnosis not present

## 2018-10-09 DIAGNOSIS — R001 Bradycardia, unspecified: Secondary | ICD-10-CM | POA: Diagnosis not present

## 2018-10-09 DIAGNOSIS — I10 Essential (primary) hypertension: Secondary | ICD-10-CM | POA: Diagnosis present

## 2018-10-09 DIAGNOSIS — E119 Type 2 diabetes mellitus without complications: Secondary | ICD-10-CM | POA: Diagnosis present

## 2018-10-10 DIAGNOSIS — E119 Type 2 diabetes mellitus without complications: Secondary | ICD-10-CM | POA: Diagnosis not present

## 2018-10-10 DIAGNOSIS — I252 Old myocardial infarction: Secondary | ICD-10-CM | POA: Diagnosis not present

## 2018-10-10 DIAGNOSIS — I10 Essential (primary) hypertension: Secondary | ICD-10-CM | POA: Diagnosis not present

## 2018-10-10 DIAGNOSIS — I340 Nonrheumatic mitral (valve) insufficiency: Secondary | ICD-10-CM | POA: Diagnosis not present

## 2018-10-10 DIAGNOSIS — E7800 Pure hypercholesterolemia, unspecified: Secondary | ICD-10-CM | POA: Diagnosis not present

## 2018-10-10 DIAGNOSIS — E785 Hyperlipidemia, unspecified: Secondary | ICD-10-CM | POA: Diagnosis not present

## 2018-10-10 DIAGNOSIS — Z955 Presence of coronary angioplasty implant and graft: Secondary | ICD-10-CM | POA: Diagnosis not present

## 2018-10-10 DIAGNOSIS — I25118 Atherosclerotic heart disease of native coronary artery with other forms of angina pectoris: Secondary | ICD-10-CM | POA: Diagnosis not present

## 2018-10-10 DIAGNOSIS — I25119 Atherosclerotic heart disease of native coronary artery with unspecified angina pectoris: Secondary | ICD-10-CM | POA: Diagnosis not present

## 2018-10-10 DIAGNOSIS — F17210 Nicotine dependence, cigarettes, uncomplicated: Secondary | ICD-10-CM | POA: Diagnosis not present

## 2018-10-10 DIAGNOSIS — G4733 Obstructive sleep apnea (adult) (pediatric): Secondary | ICD-10-CM | POA: Diagnosis not present

## 2018-10-18 DIAGNOSIS — E1165 Type 2 diabetes mellitus with hyperglycemia: Secondary | ICD-10-CM | POA: Diagnosis not present

## 2018-10-18 DIAGNOSIS — F329 Major depressive disorder, single episode, unspecified: Secondary | ICD-10-CM | POA: Diagnosis not present

## 2018-10-18 DIAGNOSIS — D649 Anemia, unspecified: Secondary | ICD-10-CM | POA: Diagnosis not present

## 2018-10-18 DIAGNOSIS — I2510 Atherosclerotic heart disease of native coronary artery without angina pectoris: Secondary | ICD-10-CM | POA: Diagnosis not present

## 2018-10-18 DIAGNOSIS — D539 Nutritional anemia, unspecified: Secondary | ICD-10-CM | POA: Diagnosis not present

## 2018-10-25 DIAGNOSIS — I252 Old myocardial infarction: Secondary | ICD-10-CM | POA: Diagnosis not present

## 2018-10-25 DIAGNOSIS — Z720 Tobacco use: Secondary | ICD-10-CM | POA: Diagnosis not present

## 2018-10-25 DIAGNOSIS — E785 Hyperlipidemia, unspecified: Secondary | ICD-10-CM | POA: Diagnosis not present

## 2018-10-25 DIAGNOSIS — I340 Nonrheumatic mitral (valve) insufficiency: Secondary | ICD-10-CM | POA: Diagnosis not present

## 2018-10-25 DIAGNOSIS — I2510 Atherosclerotic heart disease of native coronary artery without angina pectoris: Secondary | ICD-10-CM | POA: Diagnosis not present

## 2018-10-25 DIAGNOSIS — D508 Other iron deficiency anemias: Secondary | ICD-10-CM | POA: Diagnosis not present

## 2018-10-25 DIAGNOSIS — I10 Essential (primary) hypertension: Secondary | ICD-10-CM | POA: Diagnosis not present

## 2018-11-01 DIAGNOSIS — R942 Abnormal results of pulmonary function studies: Secondary | ICD-10-CM | POA: Diagnosis not present

## 2018-11-01 DIAGNOSIS — Z720 Tobacco use: Secondary | ICD-10-CM | POA: Diagnosis not present

## 2018-11-01 DIAGNOSIS — F17210 Nicotine dependence, cigarettes, uncomplicated: Secondary | ICD-10-CM | POA: Diagnosis not present

## 2018-11-01 DIAGNOSIS — R0602 Shortness of breath: Secondary | ICD-10-CM | POA: Diagnosis not present

## 2018-11-01 DIAGNOSIS — R05 Cough: Secondary | ICD-10-CM | POA: Diagnosis not present

## 2018-11-05 DIAGNOSIS — M4326 Fusion of spine, lumbar region: Secondary | ICD-10-CM | POA: Diagnosis not present

## 2018-11-05 DIAGNOSIS — G894 Chronic pain syndrome: Secondary | ICD-10-CM | POA: Diagnosis not present

## 2018-11-05 DIAGNOSIS — M545 Low back pain: Secondary | ICD-10-CM | POA: Diagnosis not present

## 2018-11-13 DIAGNOSIS — I10 Essential (primary) hypertension: Secondary | ICD-10-CM | POA: Diagnosis not present

## 2018-11-13 DIAGNOSIS — Z7902 Long term (current) use of antithrombotics/antiplatelets: Secondary | ICD-10-CM | POA: Diagnosis not present

## 2018-11-13 DIAGNOSIS — Z955 Presence of coronary angioplasty implant and graft: Secondary | ICD-10-CM | POA: Diagnosis not present

## 2018-11-13 DIAGNOSIS — Z79899 Other long term (current) drug therapy: Secondary | ICD-10-CM | POA: Diagnosis not present

## 2018-11-14 DIAGNOSIS — I10 Essential (primary) hypertension: Secondary | ICD-10-CM | POA: Diagnosis not present

## 2018-11-14 DIAGNOSIS — Z955 Presence of coronary angioplasty implant and graft: Secondary | ICD-10-CM | POA: Diagnosis not present

## 2018-11-14 DIAGNOSIS — Z7902 Long term (current) use of antithrombotics/antiplatelets: Secondary | ICD-10-CM | POA: Diagnosis not present

## 2018-11-14 DIAGNOSIS — Z79899 Other long term (current) drug therapy: Secondary | ICD-10-CM | POA: Diagnosis not present

## 2018-11-16 DIAGNOSIS — I10 Essential (primary) hypertension: Secondary | ICD-10-CM | POA: Diagnosis not present

## 2018-11-16 DIAGNOSIS — Z79899 Other long term (current) drug therapy: Secondary | ICD-10-CM | POA: Diagnosis not present

## 2018-11-16 DIAGNOSIS — Z7902 Long term (current) use of antithrombotics/antiplatelets: Secondary | ICD-10-CM | POA: Diagnosis not present

## 2018-11-16 DIAGNOSIS — Z955 Presence of coronary angioplasty implant and graft: Secondary | ICD-10-CM | POA: Diagnosis not present

## 2018-11-19 DIAGNOSIS — Z955 Presence of coronary angioplasty implant and graft: Secondary | ICD-10-CM | POA: Diagnosis not present

## 2018-11-19 DIAGNOSIS — I10 Essential (primary) hypertension: Secondary | ICD-10-CM | POA: Diagnosis not present

## 2018-11-19 DIAGNOSIS — Z7902 Long term (current) use of antithrombotics/antiplatelets: Secondary | ICD-10-CM | POA: Diagnosis not present

## 2018-11-19 DIAGNOSIS — Z79899 Other long term (current) drug therapy: Secondary | ICD-10-CM | POA: Diagnosis not present

## 2018-11-23 DIAGNOSIS — I10 Essential (primary) hypertension: Secondary | ICD-10-CM | POA: Diagnosis not present

## 2018-11-23 DIAGNOSIS — Z955 Presence of coronary angioplasty implant and graft: Secondary | ICD-10-CM | POA: Diagnosis not present

## 2018-11-23 DIAGNOSIS — Z7902 Long term (current) use of antithrombotics/antiplatelets: Secondary | ICD-10-CM | POA: Diagnosis not present

## 2018-11-23 DIAGNOSIS — Z79899 Other long term (current) drug therapy: Secondary | ICD-10-CM | POA: Diagnosis not present

## 2018-11-26 DIAGNOSIS — Z7902 Long term (current) use of antithrombotics/antiplatelets: Secondary | ICD-10-CM | POA: Diagnosis not present

## 2018-11-26 DIAGNOSIS — Z79899 Other long term (current) drug therapy: Secondary | ICD-10-CM | POA: Diagnosis not present

## 2018-11-26 DIAGNOSIS — Z955 Presence of coronary angioplasty implant and graft: Secondary | ICD-10-CM | POA: Diagnosis not present

## 2018-11-26 DIAGNOSIS — I10 Essential (primary) hypertension: Secondary | ICD-10-CM | POA: Diagnosis not present

## 2018-11-28 DIAGNOSIS — Z955 Presence of coronary angioplasty implant and graft: Secondary | ICD-10-CM | POA: Diagnosis not present

## 2018-11-28 DIAGNOSIS — Z7902 Long term (current) use of antithrombotics/antiplatelets: Secondary | ICD-10-CM | POA: Diagnosis not present

## 2018-11-28 DIAGNOSIS — Z79899 Other long term (current) drug therapy: Secondary | ICD-10-CM | POA: Diagnosis not present

## 2018-11-28 DIAGNOSIS — I10 Essential (primary) hypertension: Secondary | ICD-10-CM | POA: Diagnosis not present

## 2018-11-30 DIAGNOSIS — Z955 Presence of coronary angioplasty implant and graft: Secondary | ICD-10-CM | POA: Diagnosis not present

## 2018-11-30 DIAGNOSIS — Z79899 Other long term (current) drug therapy: Secondary | ICD-10-CM | POA: Diagnosis not present

## 2018-11-30 DIAGNOSIS — I10 Essential (primary) hypertension: Secondary | ICD-10-CM | POA: Diagnosis not present

## 2018-11-30 DIAGNOSIS — Z7902 Long term (current) use of antithrombotics/antiplatelets: Secondary | ICD-10-CM | POA: Diagnosis not present

## 2018-12-05 DIAGNOSIS — G894 Chronic pain syndrome: Secondary | ICD-10-CM | POA: Diagnosis not present

## 2018-12-05 DIAGNOSIS — M545 Low back pain: Secondary | ICD-10-CM | POA: Diagnosis not present

## 2018-12-05 DIAGNOSIS — M4326 Fusion of spine, lumbar region: Secondary | ICD-10-CM | POA: Diagnosis not present

## 2018-12-07 DIAGNOSIS — Z955 Presence of coronary angioplasty implant and graft: Secondary | ICD-10-CM | POA: Diagnosis not present

## 2018-12-07 DIAGNOSIS — I25118 Atherosclerotic heart disease of native coronary artery with other forms of angina pectoris: Secondary | ICD-10-CM | POA: Diagnosis not present

## 2018-12-10 DIAGNOSIS — I25118 Atherosclerotic heart disease of native coronary artery with other forms of angina pectoris: Secondary | ICD-10-CM | POA: Diagnosis not present

## 2018-12-10 DIAGNOSIS — Z955 Presence of coronary angioplasty implant and graft: Secondary | ICD-10-CM | POA: Diagnosis not present

## 2018-12-12 DIAGNOSIS — Z955 Presence of coronary angioplasty implant and graft: Secondary | ICD-10-CM | POA: Diagnosis not present

## 2018-12-12 DIAGNOSIS — I25118 Atherosclerotic heart disease of native coronary artery with other forms of angina pectoris: Secondary | ICD-10-CM | POA: Diagnosis not present

## 2018-12-14 DIAGNOSIS — I25118 Atherosclerotic heart disease of native coronary artery with other forms of angina pectoris: Secondary | ICD-10-CM | POA: Diagnosis not present

## 2018-12-14 DIAGNOSIS — Z955 Presence of coronary angioplasty implant and graft: Secondary | ICD-10-CM | POA: Diagnosis not present

## 2018-12-14 DIAGNOSIS — D500 Iron deficiency anemia secondary to blood loss (chronic): Secondary | ICD-10-CM | POA: Diagnosis not present

## 2018-12-14 DIAGNOSIS — I252 Old myocardial infarction: Secondary | ICD-10-CM | POA: Diagnosis not present

## 2018-12-14 DIAGNOSIS — R9431 Abnormal electrocardiogram [ECG] [EKG]: Secondary | ICD-10-CM | POA: Diagnosis not present

## 2018-12-14 DIAGNOSIS — I10 Essential (primary) hypertension: Secondary | ICD-10-CM | POA: Diagnosis not present

## 2018-12-17 DIAGNOSIS — Z955 Presence of coronary angioplasty implant and graft: Secondary | ICD-10-CM | POA: Diagnosis not present

## 2018-12-17 DIAGNOSIS — I25118 Atherosclerotic heart disease of native coronary artery with other forms of angina pectoris: Secondary | ICD-10-CM | POA: Diagnosis not present

## 2018-12-21 DIAGNOSIS — I25118 Atherosclerotic heart disease of native coronary artery with other forms of angina pectoris: Secondary | ICD-10-CM | POA: Diagnosis not present

## 2018-12-21 DIAGNOSIS — Z955 Presence of coronary angioplasty implant and graft: Secondary | ICD-10-CM | POA: Diagnosis not present

## 2018-12-24 DIAGNOSIS — Z955 Presence of coronary angioplasty implant and graft: Secondary | ICD-10-CM | POA: Diagnosis not present

## 2018-12-24 DIAGNOSIS — I25118 Atherosclerotic heart disease of native coronary artery with other forms of angina pectoris: Secondary | ICD-10-CM | POA: Diagnosis not present

## 2018-12-25 DIAGNOSIS — Z23 Encounter for immunization: Secondary | ICD-10-CM | POA: Diagnosis not present

## 2018-12-28 DIAGNOSIS — Z955 Presence of coronary angioplasty implant and graft: Secondary | ICD-10-CM | POA: Diagnosis not present

## 2018-12-28 DIAGNOSIS — I25118 Atherosclerotic heart disease of native coronary artery with other forms of angina pectoris: Secondary | ICD-10-CM | POA: Diagnosis not present

## 2018-12-31 DIAGNOSIS — Z955 Presence of coronary angioplasty implant and graft: Secondary | ICD-10-CM | POA: Diagnosis not present

## 2018-12-31 DIAGNOSIS — I25118 Atherosclerotic heart disease of native coronary artery with other forms of angina pectoris: Secondary | ICD-10-CM | POA: Diagnosis not present

## 2019-01-02 DIAGNOSIS — Z955 Presence of coronary angioplasty implant and graft: Secondary | ICD-10-CM | POA: Diagnosis not present

## 2019-01-02 DIAGNOSIS — I25118 Atherosclerotic heart disease of native coronary artery with other forms of angina pectoris: Secondary | ICD-10-CM | POA: Diagnosis not present

## 2019-01-03 DIAGNOSIS — Z79899 Other long term (current) drug therapy: Secondary | ICD-10-CM | POA: Diagnosis not present

## 2019-01-03 DIAGNOSIS — M545 Low back pain: Secondary | ICD-10-CM | POA: Diagnosis not present

## 2019-01-03 DIAGNOSIS — G894 Chronic pain syndrome: Secondary | ICD-10-CM | POA: Diagnosis not present

## 2019-01-03 DIAGNOSIS — Z79891 Long term (current) use of opiate analgesic: Secondary | ICD-10-CM | POA: Diagnosis not present

## 2019-01-03 DIAGNOSIS — M4326 Fusion of spine, lumbar region: Secondary | ICD-10-CM | POA: Diagnosis not present

## 2019-01-04 DIAGNOSIS — I25118 Atherosclerotic heart disease of native coronary artery with other forms of angina pectoris: Secondary | ICD-10-CM | POA: Diagnosis not present

## 2019-01-04 DIAGNOSIS — Z955 Presence of coronary angioplasty implant and graft: Secondary | ICD-10-CM | POA: Diagnosis not present

## 2019-01-07 DIAGNOSIS — I208 Other forms of angina pectoris: Secondary | ICD-10-CM | POA: Diagnosis not present

## 2019-01-07 DIAGNOSIS — Z955 Presence of coronary angioplasty implant and graft: Secondary | ICD-10-CM | POA: Diagnosis not present

## 2019-01-11 DIAGNOSIS — Z955 Presence of coronary angioplasty implant and graft: Secondary | ICD-10-CM | POA: Diagnosis not present

## 2019-01-11 DIAGNOSIS — I208 Other forms of angina pectoris: Secondary | ICD-10-CM | POA: Diagnosis not present

## 2019-01-14 DIAGNOSIS — I208 Other forms of angina pectoris: Secondary | ICD-10-CM | POA: Diagnosis not present

## 2019-01-14 DIAGNOSIS — Z955 Presence of coronary angioplasty implant and graft: Secondary | ICD-10-CM | POA: Diagnosis not present

## 2019-01-18 DIAGNOSIS — Z955 Presence of coronary angioplasty implant and graft: Secondary | ICD-10-CM | POA: Diagnosis not present

## 2019-01-18 DIAGNOSIS — I208 Other forms of angina pectoris: Secondary | ICD-10-CM | POA: Diagnosis not present

## 2019-01-25 DIAGNOSIS — Z955 Presence of coronary angioplasty implant and graft: Secondary | ICD-10-CM | POA: Diagnosis not present

## 2019-01-25 DIAGNOSIS — I208 Other forms of angina pectoris: Secondary | ICD-10-CM | POA: Diagnosis not present

## 2019-01-29 DIAGNOSIS — Z955 Presence of coronary angioplasty implant and graft: Secondary | ICD-10-CM | POA: Diagnosis not present

## 2019-01-29 DIAGNOSIS — I208 Other forms of angina pectoris: Secondary | ICD-10-CM | POA: Diagnosis not present

## 2019-01-30 DIAGNOSIS — I208 Other forms of angina pectoris: Secondary | ICD-10-CM | POA: Diagnosis not present

## 2019-01-30 DIAGNOSIS — Z955 Presence of coronary angioplasty implant and graft: Secondary | ICD-10-CM | POA: Diagnosis not present

## 2019-02-04 DIAGNOSIS — I208 Other forms of angina pectoris: Secondary | ICD-10-CM | POA: Diagnosis not present

## 2019-02-04 DIAGNOSIS — Z955 Presence of coronary angioplasty implant and graft: Secondary | ICD-10-CM | POA: Diagnosis not present

## 2020-04-22 DIAGNOSIS — E1165 Type 2 diabetes mellitus with hyperglycemia: Secondary | ICD-10-CM | POA: Diagnosis not present

## 2020-04-22 DIAGNOSIS — E782 Mixed hyperlipidemia: Secondary | ICD-10-CM | POA: Diagnosis not present

## 2020-04-22 DIAGNOSIS — Z6826 Body mass index (BMI) 26.0-26.9, adult: Secondary | ICD-10-CM | POA: Diagnosis not present

## 2020-04-22 DIAGNOSIS — Z79899 Other long term (current) drug therapy: Secondary | ICD-10-CM | POA: Diagnosis not present

## 2020-07-30 DIAGNOSIS — R82998 Other abnormal findings in urine: Secondary | ICD-10-CM | POA: Diagnosis not present

## 2020-07-30 DIAGNOSIS — M545 Low back pain, unspecified: Secondary | ICD-10-CM | POA: Diagnosis not present

## 2020-07-30 DIAGNOSIS — R3129 Other microscopic hematuria: Secondary | ICD-10-CM | POA: Diagnosis not present

## 2020-08-25 DIAGNOSIS — M545 Low back pain, unspecified: Secondary | ICD-10-CM | POA: Diagnosis not present

## 2020-08-25 DIAGNOSIS — M5441 Lumbago with sciatica, right side: Secondary | ICD-10-CM | POA: Diagnosis not present

## 2020-08-31 DIAGNOSIS — I10 Essential (primary) hypertension: Secondary | ICD-10-CM | POA: Diagnosis not present

## 2020-08-31 DIAGNOSIS — Z6827 Body mass index (BMI) 27.0-27.9, adult: Secondary | ICD-10-CM | POA: Diagnosis not present

## 2020-08-31 DIAGNOSIS — M4326 Fusion of spine, lumbar region: Secondary | ICD-10-CM | POA: Diagnosis not present

## 2020-08-31 DIAGNOSIS — G894 Chronic pain syndrome: Secondary | ICD-10-CM | POA: Diagnosis not present

## 2020-08-31 DIAGNOSIS — M545 Low back pain, unspecified: Secondary | ICD-10-CM | POA: Diagnosis not present

## 2020-09-04 ENCOUNTER — Emergency Department (HOSPITAL_COMMUNITY): Payer: Medicare Other

## 2020-09-04 ENCOUNTER — Inpatient Hospital Stay (HOSPITAL_COMMUNITY)
Admission: EM | Admit: 2020-09-04 | Discharge: 2020-09-07 | DRG: 917 | Disposition: A | Discharge status: Home / Self Care | Payer: Medicare Other | Attending: Internal Medicine | Admitting: Internal Medicine

## 2020-09-04 DIAGNOSIS — R6520 Severe sepsis without septic shock: Secondary | ICD-10-CM | POA: Diagnosis not present

## 2020-09-04 DIAGNOSIS — E871 Hypo-osmolality and hyponatremia: Secondary | ICD-10-CM | POA: Diagnosis present

## 2020-09-04 DIAGNOSIS — R319 Hematuria, unspecified: Secondary | ICD-10-CM | POA: Diagnosis present

## 2020-09-04 DIAGNOSIS — R4020 Unspecified coma: Secondary | ICD-10-CM | POA: Diagnosis not present

## 2020-09-04 DIAGNOSIS — N179 Acute kidney failure, unspecified: Secondary | ICD-10-CM | POA: Diagnosis not present

## 2020-09-04 DIAGNOSIS — I959 Hypotension, unspecified: Secondary | ICD-10-CM

## 2020-09-04 DIAGNOSIS — N1330 Unspecified hydronephrosis: Secondary | ICD-10-CM | POA: Diagnosis not present

## 2020-09-04 DIAGNOSIS — F32A Depression, unspecified: Secondary | ICD-10-CM | POA: Diagnosis present

## 2020-09-04 DIAGNOSIS — K766 Portal hypertension: Secondary | ICD-10-CM | POA: Diagnosis not present

## 2020-09-04 DIAGNOSIS — K279 Peptic ulcer, site unspecified, unspecified as acute or chronic, without hemorrhage or perforation: Secondary | ICD-10-CM

## 2020-09-04 DIAGNOSIS — R55 Syncope and collapse: Secondary | ICD-10-CM | POA: Diagnosis not present

## 2020-09-04 DIAGNOSIS — I214 Non-ST elevation (NSTEMI) myocardial infarction: Secondary | ICD-10-CM

## 2020-09-04 DIAGNOSIS — D509 Iron deficiency anemia, unspecified: Secondary | ICD-10-CM | POA: Diagnosis present

## 2020-09-04 DIAGNOSIS — I2510 Atherosclerotic heart disease of native coronary artery without angina pectoris: Secondary | ICD-10-CM | POA: Diagnosis present

## 2020-09-04 DIAGNOSIS — Z794 Long term (current) use of insulin: Secondary | ICD-10-CM

## 2020-09-04 DIAGNOSIS — R410 Disorientation, unspecified: Secondary | ICD-10-CM | POA: Diagnosis not present

## 2020-09-04 DIAGNOSIS — K7030 Alcoholic cirrhosis of liver without ascites: Secondary | ICD-10-CM | POA: Diagnosis present

## 2020-09-04 DIAGNOSIS — I213 ST elevation (STEMI) myocardial infarction of unspecified site: Secondary | ICD-10-CM | POA: Diagnosis not present

## 2020-09-04 DIAGNOSIS — K2980 Duodenitis without bleeding: Secondary | ICD-10-CM

## 2020-09-04 DIAGNOSIS — E872 Acidosis: Secondary | ICD-10-CM | POA: Diagnosis present

## 2020-09-04 DIAGNOSIS — D6959 Other secondary thrombocytopenia: Secondary | ICD-10-CM | POA: Diagnosis not present

## 2020-09-04 DIAGNOSIS — I252 Old myocardial infarction: Secondary | ICD-10-CM

## 2020-09-04 DIAGNOSIS — Z7982 Long term (current) use of aspirin: Secondary | ICD-10-CM

## 2020-09-04 DIAGNOSIS — E8809 Other disorders of plasma-protein metabolism, not elsewhere classified: Secondary | ICD-10-CM | POA: Diagnosis present

## 2020-09-04 DIAGNOSIS — E119 Type 2 diabetes mellitus without complications: Secondary | ICD-10-CM | POA: Diagnosis present

## 2020-09-04 DIAGNOSIS — G9341 Metabolic encephalopathy: Secondary | ICD-10-CM | POA: Diagnosis present

## 2020-09-04 DIAGNOSIS — Z20822 Contact with and (suspected) exposure to covid-19: Secondary | ICD-10-CM | POA: Diagnosis not present

## 2020-09-04 DIAGNOSIS — A419 Sepsis, unspecified organism: Secondary | ICD-10-CM | POA: Diagnosis not present

## 2020-09-04 DIAGNOSIS — R404 Transient alteration of awareness: Secondary | ICD-10-CM | POA: Diagnosis not present

## 2020-09-04 DIAGNOSIS — J690 Pneumonitis due to inhalation of food and vomit: Secondary | ICD-10-CM | POA: Diagnosis present

## 2020-09-04 DIAGNOSIS — Z7984 Long term (current) use of oral hypoglycemic drugs: Secondary | ICD-10-CM

## 2020-09-04 DIAGNOSIS — N401 Enlarged prostate with lower urinary tract symptoms: Secondary | ICD-10-CM | POA: Diagnosis present

## 2020-09-04 DIAGNOSIS — Z955 Presence of coronary angioplasty implant and graft: Secondary | ICD-10-CM

## 2020-09-04 DIAGNOSIS — E1169 Type 2 diabetes mellitus with other specified complication: Secondary | ICD-10-CM

## 2020-09-04 DIAGNOSIS — R6521 Severe sepsis with septic shock: Secondary | ICD-10-CM

## 2020-09-04 DIAGNOSIS — I700 Atherosclerosis of aorta: Secondary | ICD-10-CM | POA: Diagnosis not present

## 2020-09-04 DIAGNOSIS — R109 Unspecified abdominal pain: Secondary | ICD-10-CM

## 2020-09-04 DIAGNOSIS — T426X1A Poisoning by other antiepileptic and sedative-hypnotic drugs, accidental (unintentional), initial encounter: Principal | ICD-10-CM | POA: Diagnosis present

## 2020-09-04 DIAGNOSIS — E861 Hypovolemia: Secondary | ICD-10-CM | POA: Diagnosis present

## 2020-09-04 DIAGNOSIS — R4182 Altered mental status, unspecified: Secondary | ICD-10-CM | POA: Diagnosis not present

## 2020-09-04 DIAGNOSIS — J189 Pneumonia, unspecified organism: Secondary | ICD-10-CM

## 2020-09-04 DIAGNOSIS — I10 Essential (primary) hypertension: Secondary | ICD-10-CM | POA: Diagnosis present

## 2020-09-04 DIAGNOSIS — R338 Other retention of urine: Secondary | ICD-10-CM | POA: Diagnosis present

## 2020-09-04 DIAGNOSIS — J449 Chronic obstructive pulmonary disease, unspecified: Secondary | ICD-10-CM | POA: Diagnosis present

## 2020-09-04 DIAGNOSIS — E785 Hyperlipidemia, unspecified: Secondary | ICD-10-CM | POA: Diagnosis present

## 2020-09-04 DIAGNOSIS — Z9189 Other specified personal risk factors, not elsewhere classified: Secondary | ICD-10-CM

## 2020-09-04 DIAGNOSIS — N200 Calculus of kidney: Secondary | ICD-10-CM | POA: Diagnosis not present

## 2020-09-04 DIAGNOSIS — Z79899 Other long term (current) drug therapy: Secondary | ICD-10-CM

## 2020-09-04 DIAGNOSIS — K449 Diaphragmatic hernia without obstruction or gangrene: Secondary | ICD-10-CM | POA: Diagnosis not present

## 2020-09-04 DIAGNOSIS — R579 Shock, unspecified: Secondary | ICD-10-CM | POA: Diagnosis not present

## 2020-09-04 DIAGNOSIS — K7460 Unspecified cirrhosis of liver: Secondary | ICD-10-CM | POA: Diagnosis not present

## 2020-09-04 DIAGNOSIS — I499 Cardiac arrhythmia, unspecified: Secondary | ICD-10-CM | POA: Diagnosis not present

## 2020-09-04 HISTORY — DX: Essential (primary) hypertension: I10

## 2020-09-04 HISTORY — DX: Type 2 diabetes mellitus without complications: E11.9

## 2020-09-04 HISTORY — DX: Heart disease, unspecified: I51.9

## 2020-09-04 HISTORY — DX: Iron deficiency anemia, unspecified: D50.9

## 2020-09-04 HISTORY — DX: Sleep apnea, unspecified: G47.30

## 2020-09-04 HISTORY — DX: Tobacco use: Z72.0

## 2020-09-04 HISTORY — DX: Acute myocardial infarction, unspecified: I21.9

## 2020-09-04 HISTORY — DX: Atherosclerotic heart disease of native coronary artery without angina pectoris: I25.10

## 2020-09-04 HISTORY — DX: Hyperlipidemia, unspecified: E78.5

## 2020-09-04 HISTORY — DX: Nonrheumatic mitral (valve) insufficiency: I34.0

## 2020-09-04 LAB — CBC WITH DIFFERENTIAL/PLATELET
Abs Immature Granulocytes: 0.08 10*3/uL — ABNORMAL HIGH (ref 0.00–0.07)
Basophils Absolute: 0.1 10*3/uL (ref 0.0–0.1)
Basophils Relative: 0 %
Eosinophils Absolute: 0 10*3/uL (ref 0.0–0.5)
Eosinophils Relative: 0 %
HCT: 38.4 % — ABNORMAL LOW (ref 39.0–52.0)
Hemoglobin: 12.8 g/dL — ABNORMAL LOW (ref 13.0–17.0)
Immature Granulocytes: 1 %
Lymphocytes Relative: 13 %
Lymphs Abs: 1.6 10*3/uL (ref 0.7–4.0)
MCH: 28.6 pg (ref 26.0–34.0)
MCHC: 33.3 g/dL (ref 30.0–36.0)
MCV: 85.7 fL (ref 80.0–100.0)
Monocytes Absolute: 1 10*3/uL (ref 0.1–1.0)
Monocytes Relative: 8 %
Neutro Abs: 9 10*3/uL — ABNORMAL HIGH (ref 1.7–7.7)
Neutrophils Relative %: 78 %
Platelets: 213 10*3/uL (ref 150–400)
RBC: 4.48 MIL/uL (ref 4.22–5.81)
RDW: 14.3 % (ref 11.5–15.5)
WBC: 11.7 10*3/uL — ABNORMAL HIGH (ref 4.0–10.5)
nRBC: 0 % (ref 0.0–0.2)

## 2020-09-04 LAB — COMPREHENSIVE METABOLIC PANEL
ALT: 13 U/L (ref 0–44)
AST: 24 U/L (ref 15–41)
Albumin: 3.3 g/dL — ABNORMAL LOW (ref 3.5–5.0)
Alkaline Phosphatase: 68 U/L (ref 38–126)
Anion gap: 14 (ref 5–15)
BUN: 30 mg/dL — ABNORMAL HIGH (ref 8–23)
CO2: 16 mmol/L — ABNORMAL LOW (ref 22–32)
Calcium: 9.6 mg/dL (ref 8.9–10.3)
Chloride: 103 mmol/L (ref 98–111)
Creatinine, Ser: 1.94 mg/dL — ABNORMAL HIGH (ref 0.61–1.24)
GFR, Estimated: 36 mL/min — ABNORMAL LOW (ref >60)
Glucose, Bld: 209 mg/dL — ABNORMAL HIGH (ref 70–99)
Potassium: 4.6 mmol/L (ref 3.5–5.1)
Sodium: 133 mmol/L — ABNORMAL LOW (ref 135–145)
Total Bilirubin: 1.5 mg/dL — ABNORMAL HIGH (ref 0.3–1.2)
Total Protein: 6.3 g/dL — ABNORMAL LOW (ref 6.5–8.1)

## 2020-09-04 LAB — LACTIC ACID, PLASMA: Lactic Acid, Venous: 5.3 mmol/L (ref 0.5–1.9)

## 2020-09-04 LAB — TROPONIN I (HIGH SENSITIVITY): Troponin I (High Sensitivity): 140 ng/L (ref <18)

## 2020-09-04 MED ORDER — VANCOMYCIN HCL 1500 MG/300ML IV SOLN
1500.0000 mg | Freq: Once | INTRAVENOUS | Status: AC
  Administered 2020-09-05: 1500 mg via INTRAVENOUS
  Filled 2020-09-04: qty 300

## 2020-09-04 MED ORDER — LACTATED RINGERS IV BOLUS (SEPSIS)
500.0000 mL | Freq: Once | INTRAVENOUS | Status: AC
  Administered 2020-09-04: 500 mL via INTRAVENOUS

## 2020-09-04 MED ORDER — METRONIDAZOLE 500 MG/100ML IV SOLN
500.0000 mg | Freq: Once | INTRAVENOUS | Status: AC
  Administered 2020-09-04: 500 mg via INTRAVENOUS
  Filled 2020-09-04: qty 100

## 2020-09-04 MED ORDER — SODIUM CHLORIDE 0.9 % IV SOLN
2.0000 g | Freq: Once | INTRAVENOUS | Status: AC
  Administered 2020-09-04: 2 g via INTRAVENOUS
  Filled 2020-09-04: qty 2

## 2020-09-04 MED ORDER — LACTATED RINGERS IV SOLN: INTRAVENOUS | Status: AC

## 2020-09-04 MED ORDER — LACTATED RINGERS IV BOLUS
1000.0000 mL | Freq: Once | INTRAVENOUS | Status: AC
  Administered 2020-09-04: 1000 mL via INTRAVENOUS

## 2020-09-04 NOTE — ED Triage Notes (Signed)
Pt arrived via [facility] for cc of AMS and syncope. Pt was found on back porch unresponsive on the ground, pt was stimulated by fire and became responsive and vomited. Pt then became uncooperative, got up and began walking with unsteady gate, and slurred, nonsensical speech. Unable to recognize family on scene. A&Ox4 at baseline. BS 224.   Pt was hypotensive, tachypneic on scene and enroute. STE noted on 12 lead and was transmitted.

## 2020-09-04 NOTE — ED Provider Notes (Signed)
[facility] EMERGENCY DEPARTMENT Provider Note   CSN: [id number] Arrival date & time: 09/04/20  2132     History Chief Complaint  Patient presents with   Syncope   Altered Mental Status    Dustin Welch is a 70 y.o. male.   Altered Mental Status Level 5 caveat due to altered mental status.  Reportedly had a syncopal episode at home.  Reportedly passed out.  Has had more confusion with EMS.  Is awake and can answer some questions but cannot tell me his name.  In all extremities and following commands.  Hypotensive for EMS and upon arrival.    Past Medical History:  Diagnosis Date   Coronary artery disease    Hypertension    MI (myocardial infarction) (Normal)    from previous notes    There are no problems to display for this patient.   History reviewed. No pertinent surgical history.     No family history on file.     Home Medications Prior to Admission medications   Not on File    Allergies    Patient has no allergy information on record.  Review of Systems   Review of Systems  Unable to perform ROS: Mental status change   Physical Exam Updated Vital Signs BP (!) 89/52   Pulse 77   Temp (!) 96 F (35.6 C) (Rectal)   Resp 20   Wt 83.9 kg   SpO2 95%   Physical Exam Vitals reviewed.  Constitutional:      Appearance: Normal appearance.  HENT:     Head: Atraumatic.     Mouth/Throat:     Mouth: Mucous membranes are moist.  Eyes:     Pupils: Pupils are equal, round, and reactive to light.  Cardiovascular:     Rate and Rhythm: Regular rhythm.  Pulmonary:     Breath sounds: No wheezing or rhonchi.  Abdominal:     Tenderness: There is no abdominal tenderness.  Musculoskeletal:        General: No tenderness.     Cervical back: Neck supple.  Skin:    General: Skin is warm.     Capillary Refill: Capillary refill takes less than 2 seconds.  Neurological:     Mental Status: He is alert.     Comments: Awake and pleasant.  Able  answer many questions but cannot tell me his name.  Sitting in bed.    ED Results / Procedures / Treatments   Labs (all labs ordered are listed, but only abnormal results are displayed) Labs Reviewed  COMPREHENSIVE METABOLIC PANEL - Abnormal; Notable for the following components:      Result Value   Sodium 133 (*)    CO2 16 (*)    Glucose, Bld 209 (*)    BUN 30 (*)    Creatinine, Ser 1.94 (*)    Total Protein 6.3 (*)    Albumin 3.3 (*)    Total Bilirubin 1.5 (*)    GFR, Estimated 36 (*)    All other components within normal limits  LACTIC ACID, PLASMA - Abnormal; Notable for the following components:   Lactic Acid, Venous 5.3 (*)    All other components within normal limits  CBC WITH DIFFERENTIAL/PLATELET - Abnormal; Notable for the following components:   WBC 11.7 (*)    Hemoglobin 12.8 (*)    HCT 38.4 (*)    Neutro Abs 9.0 (*)    Abs Immature Granulocytes 0.08 (*)    All other  components within normal limits  TROPONIN I (HIGH SENSITIVITY) - Abnormal; Notable for the following components:   Troponin I (High Sensitivity) 140 (*)    All other components within normal limits  CULTURE, BLOOD (ROUTINE X 2)  CULTURE, BLOOD (ROUTINE X 2)  URINE CULTURE  RESP PANEL BY RT-PCR (FLU A&B, COVID) ARPGX2  PROTIME-INR  APTT  LACTIC ACID, PLASMA  URINALYSIS, ROUTINE W REFLEX MICROSCOPIC  TROPONIN I (HIGH SENSITIVITY)    EKG EKG Interpretation  Date/Time:  Friday September 04 2020 21:39:00 EDT Ventricular Rate:  102 PR Interval:  181 QRS Duration: 81 QT Interval:  338 QTC Calculation: 441 R Axis:   -32 Text Interpretation: Sinus tachycardia LVH with secondary repolarization abnormality Anterior infarct, old Confirmed by Davonna Belling [phone]) on 09/04/2020 9:40:33 PM  Radiology DG Chest Portable 1 View  Result Date: 09/04/2020 CLINICAL DATA:  Altered mental status. EXAM: PORTABLE CHEST 1 VIEW COMPARISON:  Jul 20, 2019 FINDINGS: Mild, diffusely increased lung markings are seen  with mild areas of atelectasis and/or infiltrate noted within the mid left lung and left lung base. There is no evidence of a pleural effusion or pneumothorax. The cardiac silhouette is mildly enlarged. Marked severity calcification of the aortic arch is noted. A radiopaque fusion plate and screws are seen overlying the cervical spine. IMPRESSION: Mild left lower lobe atelectasis and/or infiltrate. Electronically Signed   By: Virgina Norfolk M.D.   On: 09/04/2020 23:15    Procedures Procedures   Medications Ordered in ED Medications  lactated ringers infusion (has no administration in time range)  metroNIDAZOLE (FLAGYL) IVPB 500 mg (500 mg Intravenous New Bag/Given 09/04/20 2339)  vancomycin (VANCOREADY) IVPB 1500 mg/300 mL (has no administration in time range)  lactated ringers bolus 500 mL (500 mLs Intravenous New Bag/Given 09/04/20 2338)  lactated ringers bolus 1,000 mL (0 mLs Intravenous Stopped 09/04/20 2244)  lactated ringers bolus 1,000 mL (1,000 mLs Intravenous New Bag/Given 09/04/20 2244)  ceFEPIme (MAXIPIME) 2 g in sodium chloride 0.9 % 100 mL IVPB (2 g Intravenous New Bag/Given 09/04/20 2334)    ED Course  I have reviewed the triage vital signs and the nursing notes.  Pertinent labs & imaging results that were available during my care of the patient were reviewed by me and considered in my medical decision making (see chart for details).    MDM Rules/Calculators/A&P                          Patient brought in for mental status change.  Syncopal episode.  Hypotensive on arrival.  No fever orally.  However with rectal was hypothermic.  Really not complaining of any pain but does have confusion which does not appear to be his baseline.  On physical exam is not a clear source of his infection.  Antibiotics started once rectal temperature showed hypothermia.  Lactic acid elevated.  Code sepsis has been activated.  Chest x-ray showed potential pneumonia.  EKG nonspecific changes.  Troponin  mildly elevated but I think likely related to demand and potential syncope although does have cardiac history.  Care turned over to Dr. Dayna Barker  CRITICAL CARE Performed by: Davonna Belling Total critical care time: 30 minutes Critical care time was exclusive of separately billable procedures and treating other patients. Critical care was necessary to treat or prevent imminent or life-threatening deterioration. Critical care was time spent personally by me on the following activities: development of treatment plan with patient and/or surrogate as well  as nursing, discussions with consultants, evaluation of patient's response to treatment, examination of patient, obtaining history from patient or surrogate, ordering and performing treatments and interventions, ordering and review of laboratory studies, ordering and review of radiographic studies, pulse oximetry and re-evaluation of patient's condition.  Final Clinical Impression(s) / ED Diagnoses Final diagnoses:  Sepsis with acute renal failure and septic shock, due to unspecified organism, unspecified acute renal failure type [facility])    Rx / DC Orders ED Discharge Orders     None        Davonna Belling, MD 09/05/20 [phone]

## 2020-09-05 ENCOUNTER — Encounter (HOSPITAL_COMMUNITY): Payer: Self-pay

## 2020-09-05 ENCOUNTER — Emergency Department (HOSPITAL_COMMUNITY): Payer: Medicare Other

## 2020-09-05 DIAGNOSIS — I2510 Atherosclerotic heart disease of native coronary artery without angina pectoris: Secondary | ICD-10-CM | POA: Diagnosis present

## 2020-09-05 DIAGNOSIS — I252 Old myocardial infarction: Secondary | ICD-10-CM | POA: Diagnosis not present

## 2020-09-05 DIAGNOSIS — J449 Chronic obstructive pulmonary disease, unspecified: Secondary | ICD-10-CM | POA: Diagnosis present

## 2020-09-05 DIAGNOSIS — E871 Hypo-osmolality and hyponatremia: Secondary | ICD-10-CM | POA: Diagnosis present

## 2020-09-05 DIAGNOSIS — K279 Peptic ulcer, site unspecified, unspecified as acute or chronic, without hemorrhage or perforation: Secondary | ICD-10-CM

## 2020-09-05 DIAGNOSIS — F32A Depression, unspecified: Secondary | ICD-10-CM

## 2020-09-05 DIAGNOSIS — K766 Portal hypertension: Secondary | ICD-10-CM | POA: Diagnosis present

## 2020-09-05 DIAGNOSIS — E1169 Type 2 diabetes mellitus with other specified complication: Secondary | ICD-10-CM | POA: Diagnosis not present

## 2020-09-05 DIAGNOSIS — R4182 Altered mental status, unspecified: Secondary | ICD-10-CM | POA: Diagnosis not present

## 2020-09-05 DIAGNOSIS — A419 Sepsis, unspecified organism: Secondary | ICD-10-CM | POA: Diagnosis present

## 2020-09-05 DIAGNOSIS — Z7984 Long term (current) use of oral hypoglycemic drugs: Secondary | ICD-10-CM | POA: Diagnosis not present

## 2020-09-05 DIAGNOSIS — Z79899 Other long term (current) drug therapy: Secondary | ICD-10-CM | POA: Diagnosis not present

## 2020-09-05 DIAGNOSIS — I214 Non-ST elevation (NSTEMI) myocardial infarction: Secondary | ICD-10-CM | POA: Diagnosis not present

## 2020-09-05 DIAGNOSIS — N200 Calculus of kidney: Secondary | ICD-10-CM | POA: Diagnosis not present

## 2020-09-05 DIAGNOSIS — Z794 Long term (current) use of insulin: Secondary | ICD-10-CM | POA: Diagnosis not present

## 2020-09-05 DIAGNOSIS — R579 Shock, unspecified: Secondary | ICD-10-CM | POA: Diagnosis not present

## 2020-09-05 DIAGNOSIS — J189 Pneumonia, unspecified organism: Secondary | ICD-10-CM

## 2020-09-05 DIAGNOSIS — J690 Pneumonitis due to inhalation of food and vomit: Secondary | ICD-10-CM | POA: Diagnosis present

## 2020-09-05 DIAGNOSIS — K449 Diaphragmatic hernia without obstruction or gangrene: Secondary | ICD-10-CM | POA: Diagnosis not present

## 2020-09-05 DIAGNOSIS — K7030 Alcoholic cirrhosis of liver without ascites: Secondary | ICD-10-CM | POA: Diagnosis not present

## 2020-09-05 DIAGNOSIS — T426X1A Poisoning by other antiepileptic and sedative-hypnotic drugs, accidental (unintentional), initial encounter: Secondary | ICD-10-CM | POA: Diagnosis present

## 2020-09-05 DIAGNOSIS — N401 Enlarged prostate with lower urinary tract symptoms: Secondary | ICD-10-CM

## 2020-09-05 DIAGNOSIS — G9341 Metabolic encephalopathy: Secondary | ICD-10-CM | POA: Diagnosis present

## 2020-09-05 DIAGNOSIS — Z955 Presence of coronary angioplasty implant and graft: Secondary | ICD-10-CM | POA: Diagnosis not present

## 2020-09-05 DIAGNOSIS — Z9189 Other specified personal risk factors, not elsewhere classified: Secondary | ICD-10-CM | POA: Diagnosis not present

## 2020-09-05 DIAGNOSIS — N1330 Unspecified hydronephrosis: Secondary | ICD-10-CM | POA: Diagnosis not present

## 2020-09-05 DIAGNOSIS — E872 Acidosis: Secondary | ICD-10-CM | POA: Diagnosis present

## 2020-09-05 DIAGNOSIS — R6520 Severe sepsis without septic shock: Secondary | ICD-10-CM

## 2020-09-05 DIAGNOSIS — E8809 Other disorders of plasma-protein metabolism, not elsewhere classified: Secondary | ICD-10-CM | POA: Diagnosis present

## 2020-09-05 DIAGNOSIS — D509 Iron deficiency anemia, unspecified: Secondary | ICD-10-CM | POA: Diagnosis present

## 2020-09-05 DIAGNOSIS — I10 Essential (primary) hypertension: Secondary | ICD-10-CM | POA: Diagnosis present

## 2020-09-05 DIAGNOSIS — K2980 Duodenitis without bleeding: Secondary | ICD-10-CM

## 2020-09-05 DIAGNOSIS — I248 Other forms of acute ischemic heart disease: Secondary | ICD-10-CM

## 2020-09-05 DIAGNOSIS — N179 Acute kidney failure, unspecified: Secondary | ICD-10-CM | POA: Diagnosis present

## 2020-09-05 DIAGNOSIS — Z7982 Long term (current) use of aspirin: Secondary | ICD-10-CM | POA: Diagnosis not present

## 2020-09-05 DIAGNOSIS — E785 Hyperlipidemia, unspecified: Secondary | ICD-10-CM | POA: Diagnosis present

## 2020-09-05 DIAGNOSIS — D6959 Other secondary thrombocytopenia: Secondary | ICD-10-CM | POA: Diagnosis not present

## 2020-09-05 DIAGNOSIS — R410 Disorientation, unspecified: Secondary | ICD-10-CM

## 2020-09-05 DIAGNOSIS — E119 Type 2 diabetes mellitus without complications: Secondary | ICD-10-CM

## 2020-09-05 DIAGNOSIS — R799 Abnormal finding of blood chemistry, unspecified: Secondary | ICD-10-CM | POA: Diagnosis not present

## 2020-09-05 DIAGNOSIS — R778 Other specified abnormalities of plasma proteins: Secondary | ICD-10-CM | POA: Diagnosis not present

## 2020-09-05 DIAGNOSIS — Z20822 Contact with and (suspected) exposure to covid-19: Secondary | ICD-10-CM | POA: Diagnosis present

## 2020-09-05 DIAGNOSIS — K7460 Unspecified cirrhosis of liver: Secondary | ICD-10-CM | POA: Diagnosis not present

## 2020-09-05 LAB — COMPREHENSIVE METABOLIC PANEL
ALT: 13 U/L (ref 0–44)
AST: 26 U/L (ref 15–41)
Albumin: 2.6 g/dL — ABNORMAL LOW (ref 3.5–5.0)
Alkaline Phosphatase: 53 U/L (ref 38–126)
Anion gap: 8 (ref 5–15)
BUN: 27 mg/dL — ABNORMAL HIGH (ref 8–23)
CO2: 20 mmol/L — ABNORMAL LOW (ref 22–32)
Calcium: 8.6 mg/dL — ABNORMAL LOW (ref 8.9–10.3)
Chloride: 105 mmol/L (ref 98–111)
Creatinine, Ser: 1.25 mg/dL — ABNORMAL HIGH (ref 0.61–1.24)
GFR, Estimated: >60 mL/min (ref >60)
Glucose, Bld: 135 mg/dL — ABNORMAL HIGH (ref 70–99)
Potassium: 4.4 mmol/L (ref 3.5–5.1)
Sodium: 133 mmol/L — ABNORMAL LOW (ref 135–145)
Total Bilirubin: 2.1 mg/dL — ABNORMAL HIGH (ref 0.3–1.2)
Total Protein: 4.8 g/dL — ABNORMAL LOW (ref 6.5–8.1)

## 2020-09-05 LAB — LACTIC ACID, PLASMA
Lactic Acid, Venous: 1.7 mmol/L (ref 0.5–1.9)
Lactic Acid, Venous: 2.3 mmol/L (ref 0.5–1.9)
Lactic Acid, Venous: 2.9 mmol/L (ref 0.5–1.9)
Lactic Acid, Venous: 5 mmol/L (ref 0.5–1.9)

## 2020-09-05 LAB — PROTIME-INR
INR: 1.2 (ref 0.8–1.2)
Prothrombin Time: 14.8 seconds (ref 11.4–15.2)

## 2020-09-05 LAB — URINALYSIS, ROUTINE W REFLEX MICROSCOPIC
Bacteria, UA: NONE SEEN
Bilirubin Urine: NEGATIVE
Glucose, UA: 50 mg/dL — AB
Ketones, ur: NEGATIVE mg/dL
Leukocytes,Ua: NEGATIVE
Nitrite: NEGATIVE
Protein, ur: 30 mg/dL — AB
Specific Gravity, Urine: 1.016 (ref 1.005–1.030)
pH: 5 (ref 5.0–8.0)

## 2020-09-05 LAB — CBC WITH DIFFERENTIAL/PLATELET
Abs Immature Granulocytes: 0.03 10*3/uL (ref 0.00–0.07)
Basophils Absolute: 0.1 10*3/uL (ref 0.0–0.1)
Basophils Relative: 1 %
Eosinophils Absolute: 0.2 10*3/uL (ref 0.0–0.5)
Eosinophils Relative: 2 %
HCT: 30.5 % — ABNORMAL LOW (ref 39.0–52.0)
Hemoglobin: 10.1 g/dL — ABNORMAL LOW (ref 13.0–17.0)
Immature Granulocytes: 0 %
Lymphocytes Relative: 25 %
Lymphs Abs: 1.9 10*3/uL (ref 0.7–4.0)
MCH: 29 pg (ref 26.0–34.0)
MCHC: 33.1 g/dL (ref 30.0–36.0)
MCV: 87.6 fL (ref 80.0–100.0)
Monocytes Absolute: 0.6 10*3/uL (ref 0.1–1.0)
Monocytes Relative: 8 %
Neutro Abs: 4.9 10*3/uL (ref 1.7–7.7)
Neutrophils Relative %: 64 %
Platelets: 182 10*3/uL (ref 150–400)
RBC: 3.48 MIL/uL — ABNORMAL LOW (ref 4.22–5.81)
RDW: 14.5 % (ref 11.5–15.5)
WBC: 7.7 10*3/uL (ref 4.0–10.5)
nRBC: 0 % (ref 0.0–0.2)

## 2020-09-05 LAB — CBG MONITORING, ED
Glucose-Capillary: 122 mg/dL — ABNORMAL HIGH (ref 70–99)
Glucose-Capillary: 92 mg/dL (ref 70–99)

## 2020-09-05 LAB — TROPONIN I (HIGH SENSITIVITY)
Troponin I (High Sensitivity): 913 ng/L (ref <18)
Troponin I (High Sensitivity): 2345 ng/L (ref <18)
Troponin I (High Sensitivity): 3196 ng/L (ref <18)

## 2020-09-05 LAB — CORTISOL: Cortisol, Plasma: 13 ug/dL

## 2020-09-05 LAB — AMMONIA: Ammonia: 40 umol/L — ABNORMAL HIGH (ref 9–35)

## 2020-09-05 LAB — MAGNESIUM: Magnesium: 1.5 mg/dL — ABNORMAL LOW (ref 1.7–2.4)

## 2020-09-05 LAB — GLUCOSE, CAPILLARY: Glucose-Capillary: 123 mg/dL — ABNORMAL HIGH (ref 70–99)

## 2020-09-05 LAB — HEMOGLOBIN A1C
Hgb A1c MFr Bld: 8.7 % — ABNORMAL HIGH (ref 4.8–5.6)
Mean Plasma Glucose: 202.99 mg/dL

## 2020-09-05 LAB — PROCALCITONIN: Procalcitonin: <0.1 ng/mL

## 2020-09-05 LAB — APTT: aPTT: 25 seconds (ref 24–36)

## 2020-09-05 LAB — RESP PANEL BY RT-PCR (FLU A&B, COVID) ARPGX2
Influenza A by PCR: NEGATIVE
Influenza B by PCR: NEGATIVE
SARS Coronavirus 2 by RT PCR: NEGATIVE

## 2020-09-05 LAB — C-REACTIVE PROTEIN: CRP: 0.6 mg/dL (ref <1.0)

## 2020-09-05 MED ORDER — PANTOPRAZOLE SODIUM 40 MG IV SOLR
40.0000 mg | Freq: Every day | INTRAVENOUS | Status: DC
  Administered 2020-09-05 – 2020-09-06 (×2): 40 mg via INTRAVENOUS
  Filled 2020-09-05 (×2): qty 40

## 2020-09-05 MED ORDER — ASPIRIN EC 81 MG PO TBEC
81.0000 mg | DELAYED_RELEASE_TABLET | Freq: Every day | ORAL | Status: DC
  Administered 2020-09-05 – 2020-09-07 (×3): 81 mg via ORAL
  Filled 2020-09-05 (×3): qty 1

## 2020-09-05 MED ORDER — IPRATROPIUM-ALBUTEROL 0.5-2.5 (3) MG/3ML IN SOLN: 3.0000 mL | Freq: Four times a day (QID) | RESPIRATORY_TRACT | Status: DC | PRN

## 2020-09-05 MED ORDER — HALOPERIDOL LACTATE 5 MG/ML IJ SOLN
2.5000 mg | Freq: Once | INTRAMUSCULAR | Status: AC
  Administered 2020-09-05: 2.5 mg via INTRAVENOUS

## 2020-09-05 MED ORDER — VANCOMYCIN HCL 1000 MG/200ML IV SOLN: 1000.0000 mg | INTRAVENOUS | Status: DC

## 2020-09-05 MED ORDER — HALOPERIDOL LACTATE 5 MG/ML IJ SOLN
INTRAMUSCULAR | Status: AC
  Administered 2020-09-05: 2.5 mg via INTRAVENOUS
  Filled 2020-09-05: qty 1

## 2020-09-05 MED ORDER — NITROGLYCERIN 0.4 MG SL SUBL: 0.4000 mg | SUBLINGUAL_TABLET | SUBLINGUAL | Status: DC | PRN

## 2020-09-05 MED ORDER — SODIUM CHLORIDE 0.9 % IV SOLN
2.0000 g | Freq: Two times a day (BID) | INTRAVENOUS | Status: DC
  Filled 2020-09-05: qty 2

## 2020-09-05 MED ORDER — ATORVASTATIN CALCIUM 10 MG PO TABS
20.0000 mg | ORAL_TABLET | Freq: Every day | ORAL | Status: DC
  Administered 2020-09-05 – 2020-09-07 (×3): 20 mg via ORAL
  Filled 2020-09-05 (×3): qty 2

## 2020-09-05 MED ORDER — INSULIN GLARGINE 100 UNIT/ML ~~LOC~~ SOLN
15.0000 [IU] | Freq: Every evening | SUBCUTANEOUS | Status: DC
  Administered 2020-09-05 – 2020-09-06 (×2): 15 [IU] via SUBCUTANEOUS
  Filled 2020-09-05 (×4): qty 0.15

## 2020-09-05 MED ORDER — HEPARIN (PORCINE) 25000 UT/250ML-% IV SOLN
1000.0000 [IU]/h | INTRAVENOUS | Status: DC
  Administered 2020-09-05: 1000 [IU]/h via INTRAVENOUS
  Filled 2020-09-05: qty 250

## 2020-09-05 MED ORDER — TAMSULOSIN HCL 0.4 MG PO CAPS
0.4000 mg | ORAL_CAPSULE | Freq: Every day | ORAL | Status: DC
  Administered 2020-09-05 – 2020-09-06 (×2): 0.4 mg via ORAL
  Filled 2020-09-05 (×2): qty 1

## 2020-09-05 MED ORDER — SODIUM CHLORIDE 0.9 % IV SOLN
3.0000 g | Freq: Three times a day (TID) | INTRAVENOUS | Status: DC
  Administered 2020-09-05 – 2020-09-07 (×7): 3 g via INTRAVENOUS
  Filled 2020-09-05 (×5): qty 8
  Filled 2020-09-05 (×2): qty 3
  Filled 2020-09-05 (×2): qty 8

## 2020-09-05 MED ORDER — MAGNESIUM SULFATE 50 % IJ SOLN
3.0000 g | Freq: Once | INTRAVENOUS | Status: AC
  Administered 2020-09-05: 3 g via INTRAVENOUS
  Filled 2020-09-05: qty 6

## 2020-09-05 MED ORDER — LACTATED RINGERS IV BOLUS
1000.0000 mL | Freq: Once | INTRAVENOUS | Status: AC
  Administered 2020-09-05: 1000 mL via INTRAVENOUS

## 2020-09-05 MED ORDER — HEPARIN BOLUS VIA INFUSION
4000.0000 [IU] | Freq: Once | INTRAVENOUS | Status: AC
  Administered 2020-09-05: 4000 [IU] via INTRAVENOUS
  Filled 2020-09-05: qty 4000

## 2020-09-05 MED ORDER — ENOXAPARIN SODIUM 40 MG/0.4ML IJ SOSY
40.0000 mg | PREFILLED_SYRINGE | INTRAMUSCULAR | Status: DC
  Administered 2020-09-05 – 2020-09-06 (×2): 40 mg via SUBCUTANEOUS
  Filled 2020-09-05 (×2): qty 0.4

## 2020-09-05 MED ORDER — IOHEXOL 300 MG/ML  SOLN
75.0000 mL | Freq: Once | INTRAMUSCULAR | Status: AC | PRN
  Administered 2020-09-05: 75 mL via INTRAVENOUS

## 2020-09-05 MED ORDER — METOPROLOL SUCCINATE ER 100 MG PO TB24
100.0000 mg | ORAL_TABLET | Freq: Every day | ORAL | Status: DC
  Administered 2020-09-05 – 2020-09-07 (×3): 100 mg via ORAL
  Filled 2020-09-05: qty 4
  Filled 2020-09-05 (×2): qty 1

## 2020-09-05 MED ORDER — ACETAMINOPHEN 325 MG PO TABS: 650.0000 mg | ORAL_TABLET | Freq: Four times a day (QID) | ORAL | Status: DC | PRN

## 2020-09-05 MED ORDER — VENLAFAXINE HCL ER 75 MG PO CP24
150.0000 mg | ORAL_CAPSULE | Freq: Every morning | ORAL | Status: DC
  Administered 2020-09-06 – 2020-09-07 (×2): 150 mg via ORAL
  Filled 2020-09-05: qty 1
  Filled 2020-09-05 (×2): qty 2

## 2020-09-05 MED ORDER — INSULIN ASPART 100 UNIT/ML IJ SOLN
0.0000 [IU] | Freq: Three times a day (TID) | INTRAMUSCULAR | Status: DC
Start: 2020-09-05 — End: 2020-09-07
  Administered 2020-09-05 – 2020-09-06 (×3): 2 [IU] via SUBCUTANEOUS
  Administered 2020-09-07: 3 [IU] via SUBCUTANEOUS
  Administered 2020-09-07: 2 [IU] via SUBCUTANEOUS

## 2020-09-05 MED ORDER — ARIPIPRAZOLE 10 MG PO TABS
10.0000 mg | ORAL_TABLET | Freq: Every morning | ORAL | Status: DC
  Administered 2020-09-06 – 2020-09-07 (×2): 10 mg via ORAL
  Filled 2020-09-05 (×3): qty 1

## 2020-09-05 MED ORDER — ACETAMINOPHEN 650 MG RE SUPP: 650.0000 mg | Freq: Four times a day (QID) | RECTAL | Status: DC | PRN

## 2020-09-05 MED ORDER — ISOSORBIDE MONONITRATE ER 60 MG PO TB24
120.0000 mg | ORAL_TABLET | Freq: Every day | ORAL | Status: DC
  Administered 2020-09-05 – 2020-09-07 (×3): 120 mg via ORAL
  Filled 2020-09-05 (×2): qty 2
  Filled 2020-09-05: qty 4

## 2020-09-05 NOTE — Progress Notes (Signed)
ANTICOAGULATION CONSULT NOTE - Initial Consult  Pharmacy Consult for Heparin  Indication: chest pain/ACS, elevated troponin  Not on File  Patient Measurements: Weight: 83.9 kg (185 lb)  Vital Signs: Temp: 98.6 F (37 C) (07/02 0126) Temp Source: Oral (07/02 0126) BP: 114/95 (07/02 0615) Pulse Rate: 73 (07/02 0615)  Labs: Recent Labs    09/04/20 2143 09/05/20 0046 09/05/20 0500  HGB 12.8*  --  10.1*  HCT 38.4*  --  30.5*  PLT 213  --  182  APTT 25  --   --   LABPROT 14.8  --   --   INR 1.2  --   --   CREATININE 1.94*  --  1.25*  TROPONINIHS 140* 913* 2,345*    CrCl cannot be calculated (Unknown ideal weight.).   Medical History: Past Medical History:  Diagnosis Date   Coronary artery disease    Hypertension    MI (myocardial infarction) ([place])    from previous notes     Assessment: 70 y/o M presents to the ED with syncope/altered mental status. Now starting heparin for rising troponin. PTA meds reviewed. Hgb 10.1. Scr 1.25.   Goal of Therapy:  Heparin level 0.3-0.7 units/ml Monitor platelets by anticoagulation protocol: Yes   Plan:  Heparin 4000 units BOLUS Start heparin drip at 1000 units/hr 1600 Heparin level Daily CBC/Heparin level Monitor for bleeding  Narda Bonds, PharmD, BCPS Clinical Pharmacist Phone: [phone]

## 2020-09-05 NOTE — ED Notes (Signed)
Spoke with the wife update given and wife's phone given to Dr Jamse Arn.

## 2020-09-05 NOTE — ED Provider Notes (Signed)
1:09 AM Assumed care from Dr. Alvino Chapel, please see their note for full history, physical and decision making until this point. In brief this is a 70 y.o. year old male who presented to the ED tonight with Syncope and Altered Mental Status     Patient's pending labs and reevaluation for disposition.  My reevaluation patient still has elevated lactic acid although slowly improving.  His medication list does list metformin.  I also discussed with the wife and she states he had a new prescription for Neurontin recently and he she states that it just started on Thursday and he has already taken 5 too many pills.  She states when she got home today he was disoriented and did not know his name.  He was seen in abnormal things and acting abnormal.  She states that he had been not like himself since starting the Neurontin but it is significantly worse today.  Patient's mental status seems to be improving from what the previous provider described and what his wife is describing.  It is difficult to tell what happened first but does have an AKI and elevated lactic acid.  It is hard to say if he got dehydrated because to the AKI which then in turn is causing elevated lactic acid or is it related to metformin.  Suspect that his mental status could be related to infection but more likely related to taking too much Neurontin.  CT scan done to ensure no intra-abdominal or intrathoracic infection.  Does have some adenitis on CT scan with questionable ulceration of the duodenum.  Will discuss with hospitalist for admission.  CRITICAL CARE Performed by: Merrily Pew Total critical care time: 35 minutes Critical care time was exclusive of separately billable procedures and treating other patients. Critical care was necessary to treat or prevent imminent or life-threatening deterioration. Critical care was time spent personally by me on the following activities: development of treatment plan with patient and/or  surrogate as well as nursing, discussions with consultants, evaluation of patient's response to treatment, examination of patient, obtaining history from patient or surrogate, ordering and performing treatments and interventions, ordering and review of laboratory studies, ordering and review of radiographic studies, pulse oximetry and re-evaluation of patient's condition.   Labs, studies and imaging reviewed by myself and considered in medical decision making if ordered. Imaging interpreted by radiology.  Labs Reviewed  COMPREHENSIVE METABOLIC PANEL - Abnormal; Notable for the following components:      Result Value   Sodium 133 (*)    CO2 16 (*)    Glucose, Bld 209 (*)    BUN 30 (*)    Creatinine, Ser 1.94 (*)    Total Protein 6.3 (*)    Albumin 3.3 (*)    Total Bilirubin 1.5 (*)    GFR, Estimated 36 (*)    All other components within normal limits  LACTIC ACID, PLASMA - Abnormal; Notable for the following components:   Lactic Acid, Venous 5.3 (*)    All other components within normal limits  LACTIC ACID, PLASMA - Abnormal; Notable for the following components:   Lactic Acid, Venous 5.0 (*)    All other components within normal limits  CBC WITH DIFFERENTIAL/PLATELET - Abnormal; Notable for the following components:   WBC 11.7 (*)    Hemoglobin 12.8 (*)    HCT 38.4 (*)    Neutro Abs 9.0 (*)    Abs Immature Granulocytes 0.08 (*)    All other components within normal limits  TROPONIN I (  HIGH SENSITIVITY) - Abnormal; Notable for the following components:   Troponin I (High Sensitivity) 140 (*)    All other components within normal limits  CULTURE, BLOOD (ROUTINE X 2)  CULTURE, BLOOD (ROUTINE X 2)  URINE CULTURE  RESP PANEL BY RT-PCR (FLU A&B, COVID) ARPGX2  PROTIME-INR  APTT  URINALYSIS, ROUTINE W REFLEX MICROSCOPIC  TROPONIN I (HIGH SENSITIVITY)    CT Head Wo Contrast  Final Result    DG Chest Portable 1 View  Final Result    CT CHEST ABDOMEN PELVIS W CONTRAST     (Results Pending)    No follow-ups on file.    Yahir Tavano, Corene Cornea, MD 09/05/20 [phone]

## 2020-09-05 NOTE — ED Notes (Signed)
Pt to CT

## 2020-09-05 NOTE — ED Notes (Signed)
Phlebotomy at the bedside  

## 2020-09-05 NOTE — ED Notes (Signed)
Patient is sitting up eating and drinking. He is able to feed himself from the bedside table. He is no longer combative and feeling better after sleeping most of the day.

## 2020-09-05 NOTE — ED Notes (Signed)
Pt trying to get out of bed multiple times to use the restroom. Pt reminded that he has a condom catheter on and that he can use that. Pt moved to room 34 beside nurses station so that pt can be monitored for safety

## 2020-09-05 NOTE — ED Notes (Signed)
Wife at bedside. Wife states to RN that some of his belongings are missing that he had on him when coming to the hospital. Items include: wedding band, stone from ring, hearing aid, and pants. RN searched around the room to see if the items were in the floor. Items were not found. Charge nurse made aware.   Items at bedside are shirt, underwear, ring with missing stone and dentures.

## 2020-09-05 NOTE — H&P (Addendum)
History and Physical:    Dustin Welch   JOA:[id number] DOB: [birth year] DOA: 09/04/2020  Referring MD/provider: Dr. Dayna Barker PCP: Mateo Flow, MD   Patient coming from: Home  Chief Complaint: Altered mental status x4 days since being initiated on gabapentin, much worse over the past 2 days. History is per chart review and long phone conversation with patient's wife.  History of Present Illness:   Dustin Welch is an 70 y.o. male with history of hypertension, DM, CAD S/P MI and LAD stent, PUD w h/o GI bleed >10 years ago, history of alcohol use none x10 years, history of "pain medicine abuse" was in his usual state of health until 4 days ago when he received gabapentin for treatment of chronic LBP.  Patient's wife noted that he was somnolent and somewhat confused however over the last 2 days patient was severely confused, combative and yesterday was not acting correctly.  She noted that he was trying to get into his recliner from the side and that he was "talking out of his head" and "he was acting crazy".  Wife noted that he has a history of opioid abuse in the past so she counted his gabapentin pills and noted that he had taken 5 more than he was supposed to have done.  She withheld the gabapentin from him however she ended up calling EMS when "he really was not acting right".  When I go to see patient, he is lying in Trendelenburg sleeping deeply.  It takes a sternal rub to wake him up and when I do wake him up he glares at me and says "Hey, what are you doing".  He then promptly falls asleep.  As I wake him up, patient states he knows he in the hospital.  He does not know why he is here.  He denies any alcohol use.  Denies any other drug use.  He is unable to give me any other answers as he keeps on falling asleep.  ED Course:  The patient was noted to be confused and combative.  Vital signs notable for hypothermia rectally and pressures 90 over 50s.  Laboratory data were notable for  mild hyponatremia, AKI with creatinine of 1.9, increased lactate at 5.3, mild leukocytosis at 12 with no left shift.  Work-up is revealed a LLL pneumonia with debris in the trachea suggestive of aspiration.  Also incidentally noted was duodenitis without evidence of ischemic bowel, cirrhosis with portal hypertension and some obstructive uropathy secondary to prostatomegaly.  Patient's troponin was initially 140 but has climbed over the course of the night to 2,345.  EKGs have been normal.  Patient was treated with IV fluid resuscitation, started on vancomycin and cefepime for severe sepsis thought to be secondary to CAP possible aspiration pneumonia.  He was also started on heparin drip given rising troponin.  Of note patient had just received Haldol 2.5 mg IV right before I saw him.  ROS:   ROS   Review of Systems: Patient unable to provide due to altered mental status   Past Medical History:   Past Medical History:  Diagnosis Date   Coronary artery disease    Hypertension    MI (myocardial infarction) ([place])    from previous notes    Past Surgical History:   History reviewed. No pertinent surgical history.  Social History:   Social History   Socioeconomic History   Marital status: Married    Spouse name: Not on file   Number of children:  Not on file   Years of education: Not on file   Highest education level: Not on file  Occupational History   Not on file  Tobacco Use   Smoking status: Not on file   Smokeless tobacco: Not on file  Substance and Sexual Activity   Alcohol use: Not on file   Drug use: Not on file   Sexual activity: Not on file  Other Topics Concern   Not on file  Social History Narrative   Not on file   Social Determinants of Health   Financial Resource Strain: Not on file  Food Insecurity: Not on file  Transportation Needs: Not on file  Physical Activity: Not on file  Stress: Not on file  Social Connections: Not on file  Intimate Partner  Violence: Not on file    Allergies   Patient has no allergy information on record.  Family history:   No family history on file.  Current Medications:   Prior to Admission medications   Medication Sig Start Date End Date Taking? Authorizing Provider  ACCU-CHEK GUIDE test strip 1 each by Other route in the morning, at noon, and at bedtime. 06/21/20  Yes [provider]  albuterol (VENTOLIN HFA) 108 (90 Base) MCG/ACT inhaler Inhale 2 puffs into the lungs every 6 (six) hours as needed for wheezing or shortness of breath (cough).   Yes [provider]  ARIPiprazole (ABILIFY) 10 MG tablet Take 10 mg by mouth every morning. 06/30/20  Yes [provider]  aspirin 81 MG EC tablet Take 81 mg by mouth daily. 12/02/13  Yes [provider]  atorvastatin (LIPITOR) 40 MG tablet Take 20 mg by mouth daily. 06/18/20  Yes [provider]  ipratropium-albuterol (DUONEB) 0.5-2.5 (3) MG/3ML SOLN Take 3 mLs by nebulization every 6 (six) hours as needed (breathing).   Yes [provider]  isosorbide mononitrate (IMDUR) 120 MG 24 hr tablet Take 120 mg by mouth daily. 07/18/20  Yes [provider]  LANTUS SOLOSTAR 100 UNIT/ML Solostar Pen Inject 30 Units into the skin every evening. 08/04/20  Yes [provider]  metFORMIN (GLUMETZA) 500 MG (MOD) 24 hr tablet Take 1,000 mg by mouth daily with breakfast.   Yes [provider]  metoprolol succinate (TOPROL-XL) 100 MG 24 hr tablet Take 100 mg by mouth daily. 08/12/20  Yes [provider]  nitroGLYCERIN (NITROSTAT) 0.4 MG SL tablet Place 0.4 mg under the tongue every 5 (five) minutes as needed for chest pain. 07/29/20  Yes [provider]  pantoprazole (PROTONIX) 20 MG tablet Take 20 mg by mouth daily. 07/06/20  Yes [provider]  tamsulosin (FLOMAX) 0.4 MG CAPS capsule Take 0.4 mg by mouth in the morning and at bedtime.   Yes [provider]  venlafaxine XR  (EFFEXOR-XR) 150 MG 24 hr capsule Take 150 mg by mouth every morning. 08/01/20  Yes [provider]    Physical Exam:   Vitals:   09/05/20 0615 09/05/20 0700 09/05/20 0800 09/05/20 0830  BP: (!) 114/95 115/72 105/78 126/68  Pulse: 73 65 74 73  Resp: 16 11 (!) 21 17  Temp:      TempSrc:      SpO2: 100% 100% 98% 99%  Weight:         Physical Exam: Blood pressure 126/68, pulse 73, temperature 98.6 F (37 C), temperature source Oral, resp. rate 17, weight 83.9 kg, SpO2 99 %. Gen: Elderly patient snoring heavily lying in Trendelenburg on stretcher.  Patient initially difficult to arouse but arouses and was awake and alert briefly after sternal rub. Eyes: sclera anicteric, conjuctiva mildly injected bilaterally CVS: S1-S2, with occasional skipped beats, no gallops Respiratory: Transmitted upper respiratory sounds from snoring. GI: NABS, soft, NT  LE: No edema. No cyanosis Neuro: GCS 12, opens eyes to pain, is confused verbally, obeys commands Psych: patient is logical and coherent, judgement and insight appear normal, mood and affect appropriate to situation. Skin: no rashes or lesions or ulcers,    Data Review:    Labs: Basic Metabolic Panel: Recent Labs  Lab 09/04/20 2143 09/05/20 0500  NA 133* 133*  K 4.6 4.4  CL 103 105  CO2 16* 20*  GLUCOSE 209* 135*  BUN 30* 27*  CREATININE 1.94* 1.25*  CALCIUM 9.6 8.6*  MG  --  1.5*   Liver Function Tests: Recent Labs  Lab 09/04/20 2143 09/05/20 0500  AST 24 26  ALT 13 13  ALKPHOS 68 53  BILITOT 1.5* 2.1*  PROT 6.3* 4.8*  ALBUMIN 3.3* 2.6*   No results for input(s): LIPASE, AMYLASE in the last 168 hours. No results for input(s): AMMONIA in the last 168 hours. CBC: Recent Labs  Lab 09/04/20 2143 09/05/20 0500  WBC 11.7* 7.7  NEUTROABS 9.0* 4.9  HGB 12.8* 10.1*  HCT 38.4* 30.5*  MCV 85.7 87.6  PLT 213 182   Cardiac Enzymes: No results for input(s): CKTOTAL, CKMB, CKMBINDEX, TROPONINI in the last 168  hours.  BNP (last 3 results) No results for input(s): PROBNP in the last 8760 hours. CBG: No results for input(s): GLUCAP in the last 168 hours.  Urinalysis    Component Value Date/Time   COLORURINE YELLOW 09/05/2020 0317   APPEARANCEUR HAZY (A) 09/05/2020 0317   LABSPEC 1.016 09/05/2020 0317   PHURINE 5.0 09/05/2020 0317   GLUCOSEU 50 (A) 09/05/2020 0317   HGBUR LARGE (A) 09/05/2020 0317   BILIRUBINUR NEGATIVE 09/05/2020 0317   KETONESUR NEGATIVE 09/05/2020 0317   PROTEINUR 30 (A) 09/05/2020 0317   NITRITE NEGATIVE 09/05/2020 0317   LEUKOCYTESUR NEGATIVE 09/05/2020 0317      Radiographic Studies: CT Head Wo Contrast  Result Date: 09/05/2020 CLINICAL DATA:  Mental status change.  Unknown cause. EXAM: CT HEAD WITHOUT CONTRAST TECHNIQUE: Contiguous axial images were obtained from the base of the skull through the vertex without intravenous contrast. COMPARISON:  CT head 08/08/2017 FINDINGS: Brain: Cerebral ventricle sizes are concordant with the degree of cerebral volume loss. Patchy and confluent areas of decreased attenuation are noted throughout the deep and periventricular white matter of the cerebral hemispheres bilaterally, compatible with chronic microvascular ischemic disease. Chronic appearing left cerebellar infarction. No evidence of large-territorial acute infarction. No parenchymal hemorrhage. No mass lesion. No extra-axial collection. No mass effect or midline shift. No hydrocephalus. Basilar cisterns are patent. Vascular: No hyperdense vessel. Atherosclerotic calcifications are present within the cavernous internal carotid arteries. Skull: No acute fracture or focal lesion. Sinuses/Orbits: Paranasal sinuses and mastoid air cells are clear. Status post lens replacement. Otherwise orbits are unremarkable. Other: None. IMPRESSION: No acute intracranial abnormality. Electronically Signed   By: Iven Finn M.D.   On: 09/05/2020 00:21   CT CHEST ABDOMEN PELVIS W  CONTRAST  Result Date: 09/05/2020 CLINICAL DATA:  Attention, shock. Found on back porch unresponsive on the ground. Syncope. GFR 36 EXAM: CT CHEST, ABDOMEN, AND PELVIS WITH CONTRAST TECHNIQUE: Multidetector CT imaging of the chest, abdomen and pelvis was performed following the standard protocol during bolus administration of intravenous contrast.  CONTRAST:  26mL OMNIPAQUE IOHEXOL 300 MG/ML  SOLN COMPARISON:  CT abdomen pelvis 08/08/2017 FINDINGS: CHEST: Ports and Devices: None. Lungs/airways: Expiratory phase of respiration. Bilateral lower lobe subsegmental atelectasis. Hazy peribronchovascular ground-glass airspace opacities. Trace debris within the trachea. No focal consolidation. No pulmonary nodule. No pulmonary mass. No pulmonary contusion or laceration. No pneumatocele formation. The central airways are patent. Pleura: No pleural effusion. No pneumothorax. No hemothorax. Lymph Nodes: No mediastinal, hilar, or axillary lymphadenopathy. Mediastinum: No pneumomediastinum. No aortic injury or mediastinal hematoma. The thoracic aorta is normal in caliber. Severe atherosclerotic plaque. The heart is normal in size. No significant pericardial effusion. Four-vessel coronary calcifications with suggestion of coronary artery stents. Myocardial thinning and hypodensity along the left apex changes consistent with prior myocardial infarction. No central pulmonary embolus. The esophagus is unremarkable. The thyroid is unremarkable. Large hiatal hernia containing the majority of the stomach. Chest Wall / Breasts: No chest wall mass. Musculoskeletal: No acute rib or sternal fracture. No spinal fracture. Multilevel degenerative changes of the spine. Partially visualized anterior cervical surgical hardware. ABDOMEN / PELVIS: Liver: Not enlarged. Slightly nodular hepatic contour. No focal lesion. No laceration or subcapsular hematoma. Biliary System: The gallbladder is otherwise unremarkable with no radio-opaque gallstones.  No biliary ductal dilatation. Pancreas: Normal pancreatic contour. No main pancreatic duct dilatation. Spleen: Not enlarged. No focal lesion. No laceration, subcapsular hematoma, or vascular injury. A splenule is noted. Adrenal Glands: No nodularity bilaterally. Kidneys: Bilateral kidneys enhance symmetrically. Redemonstration of multiple fluid density lesions within the kidneys likely representing simple renal cysts. Subcentimeter hypodensities are too small to characterize. Redemonstration of several bilateral nephrolithiasis measuring up to 5 mm on the right and 7 mm on the left. No hydronephrosis. No contusion, laceration, or subcapsular hematoma. No injury to the vascular structures or collecting systems. Bilateral mild hydroureter. The urinary bladder is distending with urine. There is circumferential urinary bladder wall thickening. Bowel: Circumferential bowel wall thickening and mucosal hyperemia of the duodenum. Irregular walls of the second and third portion of the duodenum with query underlying ulceration. No pneumatosis. No large bowel wall thickening or dilatation. Diffuse colonic diverticulosis. The rectosigmoid colon is under distended. The appendix is unremarkable. Mesentery, Omentum, and Peritoneum: No simple free fluid ascites. No pneumoperitoneum. No hemoperitoneum. No mesenteric hematoma identified. No organized fluid collection. Pelvic Organs: The prostate is enlarged measuring up to 5 cm. Lymph Nodes: No abdominal, pelvic, inguinal lymphadenopathy. Vasculature: Recanalized paraumbilical vein. Severe atherosclerotic plaque. No abdominal aorta or iliac aneurysm. No active contrast extravasation or pseudoaneurysm. Musculoskeletal: No significant soft tissue hematoma. Tiny fat containing umbilical hernia. Status post posterior and interbody L4-L5 surgical fusion. No acute pelvic fracture. No spinal fracture. Multilevel degenerative changes of the spine. IMPRESSION: 1. Pulmonary findings suggestive  of aspiration pneumonia with debris noted within the trachea. 2. Duodenitis with question of ulceration of the second and third portion of the duodenum. No definite bowel ischemia or perforation. 3. Circumferential urinary bladder wall thickening and bilateral mild hydroureter. Findings likely due to obstructive uropathy in the setting of prostatomegaly. Superimposed infection or underlying malignancy not excluded. Correlate with urinalysis. 4. Cirrhosis with portal hypertension. Limited evaluation for hepatic lesions on this single phase contrast study. Recommend nonemergent MRI liver protocol. 5. No acute traumatic injury to the chest, abdomen, or pelvis. 6. No acute fracture or traumatic malalignment of the thoracic or lumbar spine. Other imaging findings of potential clinical significance: 1. Similar-appearing large hiatal hernia containing the majority of the stomach with no findings of bowel obstruction  or ischemia. 2. Cholelithiasis no findings of acute cholecystitis. 3. Nonobstructive bilateral nephrolithiasis. 4. Aortic Atherosclerosis (ICD10-I70.0) including four vessel coronary artery calcifications with findings suggestive of prior myocardial infarction. Electronically Signed   By: Iven Finn M.D.   On: 09/05/2020 03:25   DG Chest Portable 1 View  Result Date: 09/04/2020 CLINICAL DATA:  Altered mental status. EXAM: PORTABLE CHEST 1 VIEW COMPARISON:  Jul 20, 2019 FINDINGS: Mild, diffusely increased lung markings are seen with mild areas of atelectasis and/or infiltrate noted within the mid left lung and left lung base. There is no evidence of a pleural effusion or pneumothorax. The cardiac silhouette is mildly enlarged. Marked severity calcification of the aortic arch is noted. A radiopaque fusion plate and screws are seen overlying the cervical spine. IMPRESSION: Mild left lower lobe atelectasis and/or infiltrate. Electronically Signed   By: Virgina Norfolk M.D.   On: 09/04/2020 23:15     EKG: Independently reviewed.  Sinus rhythm at 64.  Normal intervals.  Axis at -4.  Q waves in 1, 2, L, V2 through V6.  J-point elevation V2.  Mild in L and V1.   Assessment/Plan:   Active Problems:   Sepsis after obstetrical procedure   70 year old man admitted with altered mental status, from overuse of gabapentin LLL pneumonia, severe sepsis and NSTEMI.  Altered mental status By history, clearly initiated by gabapentin use/abuse, now worsened with severe sepsis, pneumonia, NSTEMI and being hospitalized. Unclear if there is an element of hepatic encephalopathy, however patient does not have a history of decompensated cirrhosis or history of hepatic encephalopathy per wife, ammonia pending. Just prior to my exam, patient had been given Haldol for being combative. Will reassess patient once the Haldol wears off to see if further chemical restraints are needed.  LLL pneumonia with severe sepsis now improved Hypotension resolved with IV fluid resuscitation Lactate is decreased to 1.7 with treatment of infection, holding metformin and fluid resuscitation Given likely aspiration with altered mental status and visualized debris in trachea, will change cefepime and vancomycin to Unasyn.  NSTEMI Patient had been placed on a heparin drip however have discussed history of GI bleed with known duodenitis on CT scan with cardiology.  Given likely demand ischemia, will hold heparin drip. Continue aspirin, Toprol-XL, Imdur and atorvastatin  AKI Improving with hydration and treatment of infection Baseline creatinine in January 2020 was around 0.9 on care everywhere  Duodenitis with history of GI bleed in past when patient was drinking alcohol Change p.o. pantoprazole to IV pantoprazole Heparin being held as noted above  Hyponatremia Should improve with IV fluid resuscitation Sodium in January 2020 was 137 despite cirrhosis  DM Decrease glargine from 30 to 15 units Hold metformin SSI AC  at bedtime  Cirrhosis with portal hypertension likely from prior alcohol use, no alcohol use x10 years Patient with previous history of alcohol use per wife No alcohol use in the past 10 years Family did not know he has cirrhosis  Hematuria with prostatomegaly with mild bilateral hydroureter, likely BOO Check postvoid residual and place Foley if warranted Continue tamsulosin  H/o "abusing pain meds"  Avoid narcotics  Depression Continue Abilify Continue venlafaxine  COPD No evidence for acute flare Continue inhaled bronchodilators as warranted     Other information:   DVT prophylaxis: Lovenox Code Status: Full Family Communication: Spoke at length with patient's wife Dustin Welch Disposition Plan: TBD Consults called: Cardiology Admission status: Inpatient  [facility]  If 7PM-7AM, please contact night-coverage [email or web address]

## 2020-09-05 NOTE — ED Notes (Signed)
Sitter order placed. He is huge fall risk and now is on heparin, he continues to get out of bed. If he falls out of bed he could bleed to death. Charge nurse notified, I had two different staff members try and call for a sitter. Awaiting sitter at this time, will keep trying to redirect and 15 minute rounding.

## 2020-09-05 NOTE — ED Notes (Signed)
This RN made sure that Dr. Cyd Silence saw pt most recent troponin. New EKG obtained

## 2020-09-05 NOTE — Progress Notes (Signed)
Pharmacy Antibiotic Note  Dustin Welch is a 70 y.o. male admitted on 09/04/2020 with sepsis.  Pharmacy has been consulted for Vancomycin/Cefepime dosing. WBC mildly elevated. Noted renal dysfunction. Lactic acid elevated.   Plan: Vancomycin 1000 mg IV q24h >>Estimated AUC: 481 Cefepime 2g IV q12h Trend WBC, temp, renal function  F/U infectious work-up Drug levels as indicated   Weight: 83.9 kg (185 lb)  Temp (24hrs), Avg:97.6 F (36.4 C), Min:96 F (35.6 C), Max:98.6 F (37 C)  Recent Labs  Lab 09/04/20 2143 09/04/20 2207 09/04/20 2330  WBC 11.7*  --   --   CREATININE 1.94*  --   --   LATICACIDVEN  --  5.3* 5.0*    CrCl cannot be calculated (Unknown ideal weight.).    Not on [place], PharmD, [place] Pharmacist Phone: [phone]

## 2020-09-05 NOTE — Consult Note (Addendum)
Cardiology Consultation:   Patient ID: Dustin Welch MRN: [id number]; DOB: May 27, 1950  Admit date: 09/04/2020 Date of Consult: 09/05/2020  PCP:  Mateo Flow, MD   [facility] Providers Cardiologist:  Dr. Alfonso Patten (outside practice)     Patient Profile:   Dustin Welch is a 70 y.o. male with a hx of CAD with prior stenting, HTN, HLD, sleep apnea not previously compliant with CPAP, mild MR by echo 2019, iron deficiency anemia, tobacco use, diabetes who is being seen 09/05/2020 for the evaluation of elevated troponin at the request of Dr. Jamse Arn.  History of Present Illness:   Dustin Welch is unable to provide a history as he is altered and obtunded. No family available at bedside. Tried to call wife but got VM. Therefore details of PMH reviewed from [facility] - had prior PCI at [facility] of Cx/OM1 in 2016, NSTEMI 10/2016 s/p DES to LAD, cath 10/2018 s/p DES to prox RCA with unsuccessful PCI to [facility], cath 05/2019 with PTCA to LAD stent with 30% residual stenosis and PTCA Attempt to the diagonal / "wire probably subintimal at calcified plaque." Last cath is fully outlined below with residual disease treated medically with mild LV systolic dysfunction but EF not specifically reported with hypokinesis of mid anterior segment. (Last echo was in 2019 with mild MR and normal EF.)  He presented to the hospital with unresponsiveness. He was found on the back porch unresponsive. Per triage note, "Pt was found on back porch unresponsive on the ground, pt was stimulated by fire and became responsive and vomited. Pt then became uncooperative, got up and began walking with unsteady gate, and slurred, nonsensical speech. Unable to recognize family on scene. A&Ox4 at baseline. BS 224." There is question of syncope but unclear if this term was just used for mental status change? Presenting BP here was 67/50 and reporetd to be hypothermic by rectal temp. F/u EDP note states " His medication  list does list metformin.  I also discussed with the wife and she states he had a new prescription for Neurontin recently and he she states that it just started on Thursday and he has already taken 5 too many pills.  She states when she got home today he was disoriented and did not know his name.  He was seen in abnormal things and acting abnormal.  She states that he had been not like himself since starting the Neurontin but it is significantly worse today."  Lab derangement noted with hyponatremia of 133, AKI (initial Cr 1.9 then 1.25), hypoalbuminemia to 2.6, lactic acidosis initially 5.0, normocytic anemia with Hgb 10.1 today, mild leukocytosis, elevated Tbilirubin, Covid negative, BCx pending, hsTroponin 140 -> 913 -> 2345. Afebrile although one reading hypothermic at 19F. Lytes being managed by medicine teams. CXR with mild LLL atx vs infiltrate. Subsequent CT C/A/P showed suggestion of aspiration PNA, duodenitis, bladder wall thickening and bilateral mild hydroureter, cirrhosis with portal HTN, large hiatal hernia, cholelithiasis, nonobstructing nephrolithiasis, aortic atherosclerosis and 4V coronary calcification. CT head without acute abnormality, + atherosclerotic calcification of cavernous internal carotids. He's been treated with IV fluid resuscitation and started on antibiotics. Because of his troponin elevation, ER started him on IV heparin. He has been deemed a fall risk and has been trying to get out of bed. Received Haldol. He remains obtunded and lethargic, arousable briefly only to deep sternal rub before answering questions with brief answers. Denies ETOH. Cannot tell us where he is. Most recent BP 93/63. EKG shows sinus  tach with TWI I, avL otherwise no acute ST changes.  Addendum: Dr. Jamse Arn was able to speak with wife who reports prior h/o GI bleeding. From that conversation she feels clinical presentation most c/w gabapentin overuse.  Past Medical History:  Diagnosis Date    Coronary artery disease    Diabetes ([place])    Hyperlipidemia    Hypertension    Iron deficiency anemia    LV dysfunction    MI (myocardial infarction) ([place])    from previous notes   Mild mitral regurgitation    Sleep apnea    Tobacco abuse     History reviewed. No pertinent surgical history.   Home Medications:  Prior to Admission medications   Medication Sig Start Date End Date Taking? Authorizing Provider  ACCU-CHEK GUIDE test strip 1 each by Other route in the morning, at noon, and at bedtime. 06/21/20  Yes [provider]  albuterol (VENTOLIN HFA) 108 (90 Base) MCG/ACT inhaler Inhale 2 puffs into the lungs every 6 (six) hours as needed for wheezing or shortness of breath (cough).   Yes [provider]  ARIPiprazole (ABILIFY) 10 MG tablet Take 10 mg by mouth every morning. 06/30/20  Yes [provider]  aspirin 81 MG EC tablet Take 81 mg by mouth daily. 12/02/13  Yes [provider]  atorvastatin (LIPITOR) 40 MG tablet Take 20 mg by mouth daily. 06/18/20  Yes [provider]  ipratropium-albuterol (DUONEB) 0.5-2.5 (3) MG/3ML SOLN Take 3 mLs by nebulization every 6 (six) hours as needed (breathing).   Yes [provider]  isosorbide mononitrate (IMDUR) 120 MG 24 hr tablet Take 120 mg by mouth daily. 07/18/20  Yes [provider]  LANTUS SOLOSTAR 100 UNIT/ML Solostar Pen Inject 30 Units into the skin every evening. 08/04/20  Yes [provider]  metFORMIN (GLUMETZA) 500 MG (MOD) 24 hr tablet Take 1,000 mg by mouth daily with breakfast.   Yes [provider]  metoprolol succinate (TOPROL-XL) 100 MG 24 hr tablet Take 100 mg by mouth daily. 08/12/20  Yes [provider]  nitroGLYCERIN (NITROSTAT) 0.4 MG SL tablet Place 0.4 mg under the tongue every 5 (five) minutes as needed for chest pain. 07/29/20  Yes [provider]  pantoprazole (PROTONIX) 20 MG tablet Take 20 mg by mouth daily. 07/06/20  Yes  [provider]  tamsulosin (FLOMAX) 0.4 MG CAPS capsule Take 0.4 mg by mouth in the morning and at bedtime.   Yes [provider]  venlafaxine XR (EFFEXOR-XR) 150 MG 24 hr capsule Take 150 mg by mouth every morning. 08/01/20  Yes [provider]    Inpatient Medications: Scheduled Meds:  Continuous Infusions:  ceFEPime (MAXIPIME) IV     heparin 1,000 Units/hr (09/05/20 0716)   lactated ringers 150 mL/hr at 09/05/20 0404   magnesium sulfate bolus IVPB 3 g (09/05/20 0741)   vancomycin     PRN Meds:   Allergies:   Not on File  Social History:   Social History   Socioeconomic History   Marital status: Married    Spouse name: Not on file   Number of children: Not on file   Years of education: Not on file   Highest education level: Not on file  Occupational History   Not on file  Tobacco Use   Smoking status: Not on file   Smokeless tobacco: Not on file  Substance and Sexual Activity   Alcohol use: Not on file   Drug use: Not on file  Sexual activity: Not on file  Other Topics Concern   Not on file  Social History Narrative   Not on file   Social Determinants of Health   Financial Resource Strain: Not on file  Food Insecurity: Not on file  Transportation Needs: Not on file  Physical Activity: Not on file  Stress: Not on file  Social Connections: Not on file  Intimate Partner Violence: Not on file    Family History:   Unable to obtain due to altered mental status  ROS:  Unable to obtain due to altered mental status    Physical Exam/Data:   Vitals:   09/05/20 0700 09/05/20 0800 09/05/20 0830 09/05/20 0900  BP: 115/72 105/78 126/68 93/63  Pulse: 65 74 73 61  Resp: 11 (!) '21 17 16  ' Temp:      TempSrc:      SpO2: 100% 98% 99% 98%  Weight:        Intake/Output Summary (Last 24 hours) at 09/05/2020 0914 Last data filed at 09/05/2020 0718 Gross per 24 hour  Intake 1000 ml  Output 550 ml  Net 450 ml   Last 3 Weights 09/04/2020   Weight (lbs) 185 lb  Weight (kg) 83.915 kg     There is no height or weight on file to calculate BMI.  General: Elderly WM in no acute distress, somnolent Head: Normocephalic, atraumatic, sclera non-icteric, no xanthomas, nares are without discharge. Neck: JVP not elevated. No overt carotid bruits Lungs: Coarse BS to auscultation without wheezes, rales, or rhonchi anteriorly. Will not comply with breathing instructions. Breathing is unlabored. Heart: RRR S1 S2 without murmurs, rubs, or gallops.  Abdomen: Soft, non-tender, non-distended with normoactive bowel sounds. No rebound/guarding. Extremities: No clubbing or cyanosis. No edema. Distal pedal pulses are 2+ and equal bilaterally. Neuro: Somnolent, arouses only to deep sternal rub before falling quickly back asleep, will answer few questions but sometimes does not answer at all, does not follow commands Psych:  Somnolent, unable to assess.  EKG:  The EKG was personally reviewed and demonstrates:  Initial tracing hindered by baseline wander but shows sinus tach with occasional PACs, nonspecific TW changes I, avL F/u tracing sinus tach 102bpm, LAD, suspected LVH, TWI I, avL Today: NSR 64bpm, nonspecific TWI I, avL. QTC normal. No prior to compare to  Telemetry:  Telemetry was personally reviewed and demonstrates:  NSR  Relevant CV Studies:  2D echo - CareEverywhere 01/2018 Findings  Mitral Valve  Moderate mitral annular calcification.  Mild mitral regurgitation.  Aortic Valve  There is moderate aortic sclerosis noted, with no evidence of stenosis.  No aortic stenosis. No aortic regurgitation.  Mean transaortic gradient (Doppler) 6  Tricuspid Valve  Structurally normal tricuspid valve with no stenosis.  Pulmonic Valve  Normal pulmonic valve structure and mobility.  Left Atrium  Moderately dilated left atrium.  Left atrial volume index of 37.6 ml per meters squared BSA.  Left Ventricle  Ejection fraction is visually estimated  at 55-60%  Mild left ventricular hypertrophy  Right Atrium  Normal right atrium.  Right Ventricle  Normal right ventricle structure and function.  Pericardial Effusion  No evidence of pericardial effusion.  Pleural Effusion  No evidence of pleural effusion.  Miscellaneous  The aorta is within normal limits.  The Pulmonary artery is within normal limits.  Intact interatrial septum with no obvious shunt by color doppler.  IVC is normal and collapses   Cardiac Cath - CareEverywhere 05/2019 Severe calcification fluoroscopically.   Mild segmental LV  dysfunction  Incomplete expansion of LAD chronic stent;  iFR .81  CTO of prox RCA  Nonobstructive prox circ disease   PCI:  High pressure POBA with scoring balloon.  Post POBA iFR improved to   .91, angiographic improvement however residual remains 30%   Coronary Findings Diagnostic Dominance: Right  Left Anterior Descending: Prox LAD to Mid LAD lesion is 60% stenosed. Culprit lesion. Lesion length: 6 mm. TIMI flow is 3. The lesion is type C. The lesion is moderately calcified. The lesion is not chronically occluded. The lesion was previously treated using a stent. Stent delivery was done by way of self expansion. Previous treatment took place 1-2 years ago. The lesion has in-stent restenosis. No thrombosis in the previous stent. The stenosis was measured by a visual reading. Pressure wire/FFR was performed on the lesion. FFR: 0.81.  Left Circumflex: Ost Cx to Prox Cx lesion is 40% stenosed. Not the culprit lesion. Lesion length: 10 mm. TIMI flow is 3. The lesion is type C and irregular. The lesion is severely calcified.  Right Coronary Artery: Prox RCA to Mid RCA lesion is 100% stenosed with 0% stenosed side branch in Acute Mrg. Not the culprit lesion. TIMI flow is 0. The lesion is calcified. The lesion is chronically occluded. Right Posterior Descending Artery: RPDA filled by collaterals from Dist LAD.   Intervention  Prox LAD to Mid LAD  lesion: Angioplasty: Angioplasty using a scoring balloon was performed independent of stent deployment. Maximum pressure: 22 atm. Post-Intervention Lesion Assessment: The intervention was successful. Intentional subintimal strategy was not used. Embolic protection device was not deployed. The guidewire was not threaded through a graft to reach the lesion. The guidewire crossed the lesion. Device was not deployed. Post-intervention TIMI flow is 3. Lesion had 6 mm of its length treated. There were no complications. Pressure wire/FFR was not measured. There is a 30% residual stenosis post intervention.   Left Ventricle  The left ventricular size is normal. There is mild left ventricular systolic dysfunction. LV end diastolic pressure is elevated. There are wall motion abnormalities in the left ventricle.   Mitral Valve  There is no mitral valve stenosis.   Physician Attestation  I personally performed the entire procedure. I have personally interpreted the angiograms. I also attest that all medications given and all point of care testing performed during the procedure were ordered by me.   Wall Motion  The mid inferior, basilar anterior, basilar inferior, apical anterior andapical inferior segments are normal. The mid anterior segment is hypokinetic.     Laboratory Data:  High Sensitivity Troponin:   Recent Labs  Lab 09/04/20 2143 09/05/20 0046 09/05/20 0500  TROPONINIHS 140* 913* 2,345*     Chemistry Recent Labs  Lab 09/04/20 2143 09/05/20 0500  NA 133* 133*  K 4.6 4.4  CL 103 105  CO2 16* 20*  GLUCOSE 209* 135*  BUN 30* 27*  CREATININE 1.94* 1.25*  CALCIUM 9.6 8.6*  GFRNONAA 36* >60  ANIONGAP 14 8    Recent Labs  Lab 09/04/20 2143 09/05/20 0500  PROT 6.3* 4.8*  ALBUMIN 3.3* 2.6*  AST 24 26  ALT 13 13  ALKPHOS 68 53  BILITOT 1.5* 2.1*   Hematology Recent Labs  Lab 09/04/20 2143 09/05/20 0500  WBC 11.7* 7.7  RBC 4.48 3.48*  HGB 12.8* 10.1*  HCT 38.4* 30.5*   MCV 85.7 87.6  MCH 28.6 29.0  MCHC 33.3 33.1  RDW 14.3 14.5  PLT 213 182   BNPNo results  for input(s): BNP, PROBNP in the last 168 hours.  DDimer No results for input(s): DDIMER in the last 168 hours.   Radiology/Studies:  CT Head Wo Contrast  Result Date: 09/05/2020 CLINICAL DATA:  Mental status change.  Unknown cause. EXAM: CT HEAD WITHOUT CONTRAST TECHNIQUE: Contiguous axial images were obtained from the base of the skull through the vertex without intravenous contrast. COMPARISON:  CT head 08/08/2017 FINDINGS: Brain: Cerebral ventricle sizes are concordant with the degree of cerebral volume loss. Patchy and confluent areas of decreased attenuation are noted throughout the deep and periventricular white matter of the cerebral hemispheres bilaterally, compatible with chronic microvascular ischemic disease. Chronic appearing left cerebellar infarction. No evidence of large-territorial acute infarction. No parenchymal hemorrhage. No mass lesion. No extra-axial collection. No mass effect or midline shift. No hydrocephalus. Basilar cisterns are patent. Vascular: No hyperdense vessel. Atherosclerotic calcifications are present within the cavernous internal carotid arteries. Skull: No acute fracture or focal lesion. Sinuses/Orbits: Paranasal sinuses and mastoid air cells are clear. Status post lens replacement. Otherwise orbits are unremarkable. Other: None. IMPRESSION: No acute intracranial abnormality. Electronically Signed   By: Iven Finn M.D.   On: 09/05/2020 00:21   CT CHEST ABDOMEN PELVIS W CONTRAST  Result Date: 09/05/2020 CLINICAL DATA:  Attention, shock. Found on back porch unresponsive on the ground. Syncope. GFR 36 EXAM: CT CHEST, ABDOMEN, AND PELVIS WITH CONTRAST TECHNIQUE: Multidetector CT imaging of the chest, abdomen and pelvis was performed following the standard protocol during bolus administration of intravenous contrast. CONTRAST:  52m OMNIPAQUE IOHEXOL 300 MG/ML  SOLN  COMPARISON:  CT abdomen pelvis 08/08/2017 FINDINGS: CHEST: Ports and Devices: None. Lungs/airways: Expiratory phase of respiration. Bilateral lower lobe subsegmental atelectasis. Hazy peribronchovascular ground-glass airspace opacities. Trace debris within the trachea. No focal consolidation. No pulmonary nodule. No pulmonary mass. No pulmonary contusion or laceration. No pneumatocele formation. The central airways are patent. Pleura: No pleural effusion. No pneumothorax. No hemothorax. Lymph Nodes: No mediastinal, hilar, or axillary lymphadenopathy. Mediastinum: No pneumomediastinum. No aortic injury or mediastinal hematoma. The thoracic aorta is normal in caliber. Severe atherosclerotic plaque. The heart is normal in size. No significant pericardial effusion. Four-vessel coronary calcifications with suggestion of coronary artery stents. Myocardial thinning and hypodensity along the left apex changes consistent with prior myocardial infarction. No central pulmonary embolus. The esophagus is unremarkable. The thyroid is unremarkable. Large hiatal hernia containing the majority of the stomach. Chest Wall / Breasts: No chest wall mass. Musculoskeletal: No acute rib or sternal fracture. No spinal fracture. Multilevel degenerative changes of the spine. Partially visualized anterior cervical surgical hardware. ABDOMEN / PELVIS: Liver: Not enlarged. Slightly nodular hepatic contour. No focal lesion. No laceration or subcapsular hematoma. Biliary System: The gallbladder is otherwise unremarkable with no radio-opaque gallstones. No biliary ductal dilatation. Pancreas: Normal pancreatic contour. No main pancreatic duct dilatation. Spleen: Not enlarged. No focal lesion. No laceration, subcapsular hematoma, or vascular injury. A splenule is noted. Adrenal Glands: No nodularity bilaterally. Kidneys: Bilateral kidneys enhance symmetrically. Redemonstration of multiple fluid density lesions within the kidneys likely representing  simple renal cysts. Subcentimeter hypodensities are too small to characterize. Redemonstration of several bilateral nephrolithiasis measuring up to 5 mm on the right and 7 mm on the left. No hydronephrosis. No contusion, laceration, or subcapsular hematoma. No injury to the vascular structures or collecting systems. Bilateral mild hydroureter. The urinary bladder is distending with urine. There is circumferential urinary bladder wall thickening. Bowel: Circumferential bowel wall thickening and mucosal hyperemia of the duodenum. Irregular walls  of the second and third portion of the duodenum with query underlying ulceration. No pneumatosis. No large bowel wall thickening or dilatation. Diffuse colonic diverticulosis. The rectosigmoid colon is under distended. The appendix is unremarkable. Mesentery, Omentum, and Peritoneum: No simple free fluid ascites. No pneumoperitoneum. No hemoperitoneum. No mesenteric hematoma identified. No organized fluid collection. Pelvic Organs: The prostate is enlarged measuring up to 5 cm. Lymph Nodes: No abdominal, pelvic, inguinal lymphadenopathy. Vasculature: Recanalized paraumbilical vein. Severe atherosclerotic plaque. No abdominal aorta or iliac aneurysm. No active contrast extravasation or pseudoaneurysm. Musculoskeletal: No significant soft tissue hematoma. Tiny fat containing umbilical hernia. Status post posterior and interbody L4-L5 surgical fusion. No acute pelvic fracture. No spinal fracture. Multilevel degenerative changes of the spine. IMPRESSION: 1. Pulmonary findings suggestive of aspiration pneumonia with debris noted within the trachea. 2. Duodenitis with question of ulceration of the second and third portion of the duodenum. No definite bowel ischemia or perforation. 3. Circumferential urinary bladder wall thickening and bilateral mild hydroureter. Findings likely due to obstructive uropathy in the setting of prostatomegaly. Superimposed infection or underlying  malignancy not excluded. Correlate with urinalysis. 4. Cirrhosis with portal hypertension. Limited evaluation for hepatic lesions on this single phase contrast study. Recommend nonemergent MRI liver protocol. 5. No acute traumatic injury to the chest, abdomen, or pelvis. 6. No acute fracture or traumatic malalignment of the thoracic or lumbar spine. Other imaging findings of potential clinical significance: 1. Similar-appearing large hiatal hernia containing the majority of the stomach with no findings of bowel obstruction or ischemia. 2. Cholelithiasis no findings of acute cholecystitis. 3. Nonobstructive bilateral nephrolithiasis. 4. Aortic Atherosclerosis (ICD10-I70.0) including four vessel coronary artery calcifications with findings suggestive of prior myocardial infarction. Electronically Signed   By: Iven Finn M.D.   On: 09/05/2020 03:25   DG Chest Portable 1 View  Result Date: 09/04/2020 CLINICAL DATA:  Altered mental status. EXAM: PORTABLE CHEST 1 VIEW COMPARISON:  Jul 20, 2019 FINDINGS: Mild, diffusely increased lung markings are seen with mild areas of atelectasis and/or infiltrate noted within the mid left lung and left lung base. There is no evidence of a pleural effusion or pneumothorax. The cardiac silhouette is mildly enlarged. Marked severity calcification of the aortic arch is noted. A radiopaque fusion plate and screws are seen overlying the cervical spine. IMPRESSION: Mild left lower lobe atelectasis and/or infiltrate. Electronically Signed   By: Virgina Norfolk M.D.   On: 09/04/2020 23:15     Assessment and Plan:   1. Altered mental status with concern for sepsis physiology - multiple medical issues included lactic acidosis, hypoalbuminemia, acute kidney injury, normocytic anemia, abnormal bilirubin, hypothermia, imaging concerning for aspiration PNA and possible cirrhosis amongst other ancillary findings - further eval of AMS per primary team - have added ammonia level to  labs - per Dr. Jamse Arn, this is felt most likely due to gabapentin overuse at present time - further details of possible syncope uncertain: not clear to me if this term was just used to describe decreased consciousness upon being found or had full LOC - this is primary issue at present time  2. Elevated troponin/possible NSTEMI with known history of CAD as outlined above - patient has known substrate for demand ischemia with underlying CAD - rising troponin noted: 140->913->2345, will follow up another value now - clinical presentation is not consistent solely with ACS therefore suspect this is a secondary process although does convey higher risk of underlying morbidity/mortality and potential for progression in known CAD - has been started  on heparin by ER -> IM raised concern for propensity for GIB given duodenitis and prior history of such so I discussed with Dr. Percival Spanish and we are OK with discontinuing full dose IV heparin as risk outweighs benefit. Per our discussion, Dr. Jamse Arn will address subsequent plan for DVT ppx - will order echocardiogram - hold off beta blocker due to hypotension and statin due to AMS - will discuss further recs with MD including ASA - hold off for now given concern for potential GIB but continue to re-evaluate antiplatelet plan as clinical situation evolves  3. Essential HTN - hold home antihypertensives given severe hypotension on admission  4. Hyperlipidemia - revsit statin when more reliably able to take orals  5. H/o LV dysfunction - mild segmental LV dysfunction noted by cath 05/2019, await echocardiogram  Multiple medical issues noted, increasing patient complexity. Cardiology will follow along.   Risk Assessment/Risk Scores:     TIMI Risk Score for Unstable Angina or Non-ST Elevation MI:   The patient's TIMI risk score is 5, which indicates a 26% risk of all cause mortality, new or recurrent myocardial infarction or need for urgent  revascularization in the next 14 days.   For questions or updates, please contact [place] Please consult [email or web address] for contact info under    Signed, Dustin Pitter, PA-C  09/05/2020 9:14 AM   History and all data above reviewed.  Patient examined.  I agree with the findings as above.   The patient presented with AMS as above. He is now awake and conversant.  He is very confused.  He has had back/neck pain.  I was able to talk to his wife.  She does not report new cardiac complaints.  He has not been describing to her any chest pain or SOB.  He has not had any new increased use of NTG.     The patient exam reveals COR:RRR  ,  Lungs: Clear  ,  Abd: Positive bowel sounds, no rebound no guarding, Ext No edema   .  All available labs, radiology testing, previous records reviewed. Agree with documented assessment and plan. ELEVATED CARDIAC ENZYMES:  I don't suspect an ACS.  He has had no recent symptoms.  EKG is non acute.  He has non obstructive CAD.  I think this is demand ischemia.  I agree that heparin is not indicated with risk of bleeding.  I would suggest resuming home meds with beta blocker and Imdur and ASA.  We will check an echo.  However, I am not suspecting that we will need any other ischemia work up unless he has marked change on the echo or anginal pain as his mental status clears.     Dustin Welch  12:43 PM  09/05/2020

## 2020-09-05 NOTE — Sepsis Progress Note (Signed)
Following for sepsis monitoring ?

## 2020-09-05 NOTE — ED Notes (Signed)
PT continuing to try and get out of bed. Pt redirected and told that he is in the hospital. Pt appears annoyed that he is here

## 2020-09-06 ENCOUNTER — Inpatient Hospital Stay (HOSPITAL_COMMUNITY): Payer: Medicare Other

## 2020-09-06 DIAGNOSIS — I214 Non-ST elevation (NSTEMI) myocardial infarction: Secondary | ICD-10-CM

## 2020-09-06 DIAGNOSIS — R778 Other specified abnormalities of plasma proteins: Secondary | ICD-10-CM

## 2020-09-06 LAB — BASIC METABOLIC PANEL
Anion gap: 9 (ref 5–15)
BUN: 17 mg/dL (ref 8–23)
CO2: 24 mmol/L (ref 22–32)
Calcium: 8.6 mg/dL — ABNORMAL LOW (ref 8.9–10.3)
Chloride: 103 mmol/L (ref 98–111)
Creatinine, Ser: 0.85 mg/dL (ref 0.61–1.24)
GFR, Estimated: >60 mL/min (ref >60)
Glucose, Bld: 105 mg/dL — ABNORMAL HIGH (ref 70–99)
Potassium: 3.5 mmol/L (ref 3.5–5.1)
Sodium: 136 mmol/L (ref 135–145)

## 2020-09-06 LAB — URINE CULTURE: Culture: NO GROWTH

## 2020-09-06 LAB — CBC
HCT: 33.1 % — ABNORMAL LOW (ref 39.0–52.0)
Hemoglobin: 11.5 g/dL — ABNORMAL LOW (ref 13.0–17.0)
MCH: 29.1 pg (ref 26.0–34.0)
MCHC: 34.7 g/dL (ref 30.0–36.0)
MCV: 83.8 fL (ref 80.0–100.0)
Platelets: 129 10*3/uL — ABNORMAL LOW (ref 150–400)
RBC: 3.95 MIL/uL — ABNORMAL LOW (ref 4.22–5.81)
RDW: 14.3 % (ref 11.5–15.5)
WBC: 5.3 10*3/uL (ref 4.0–10.5)
nRBC: 0 % (ref 0.0–0.2)

## 2020-09-06 LAB — ECHOCARDIOGRAM COMPLETE
Area-P 1/2: 2.27 cm2
Height: 63 in
S' Lateral: 2.6 cm
Weight: 2960 oz

## 2020-09-06 LAB — TROPONIN I (HIGH SENSITIVITY): Troponin I (High Sensitivity): 1566 ng/L (ref <18)

## 2020-09-06 LAB — GLUCOSE, CAPILLARY
Glucose-Capillary: 93 mg/dL (ref 70–99)
Glucose-Capillary: 140 mg/dL — ABNORMAL HIGH (ref 70–99)
Glucose-Capillary: 148 mg/dL — ABNORMAL HIGH (ref 70–99)
Glucose-Capillary: 152 mg/dL — ABNORMAL HIGH (ref 70–99)

## 2020-09-06 NOTE — TOC Progression Note (Signed)
Transition of Care [facility]) - Progression Note    Patient Details  Name: Dak Szumski MRN: [id number] Date of Birth: [birth year]  Transition of Care [facility]) CM/SW Contact  Reece Agar, [place] Phone Number: 09/06/2020, 9:11 AM  Clinical Narrative:    1500: CSW attempted to visit pt for SA consult. Pt not in room, CSW will follow up.       Expected Discharge Plan and Services                                                 Social Determinants of Health (SDOH) Interventions    Readmission Risk Interventions No flowsheet data found.

## 2020-09-06 NOTE — Progress Notes (Signed)
PROGRESS NOTE    Armin Yerger  JJK:[id number] DOB: 10-Oct-1950 DOA: 09/04/2020 PCP: Mateo Flow, MD   Brief Narrative:  Dustin Welch is an 70 y.o. male with history of hypertension, DM, CAD S/P MI and LAD stent, PUD w h/o GI bleed >10 years ago, history of alcohol use none x10 years, history of "pain medicine abuse" was in his usual state of health until 4 days ago when he received new prescription of gabapentin for treatment of chronic LBP.  Patient's wife noted that he was somnolent and somewhat confused however over the last 2 days patient was severely confused, combative and yesterday was not acting correctly. Patient confused and combative in the ED with imaging concerning for left lower lobe pneumonia concerning for aspiration during mental status changes. Patient was treated with IV fluid resuscitation, started on vancomycin and cefepime for severe sepsis thought to be secondary to CAP possible aspiration pneumonia.  He was also started on heparin drip given rising troponin.  Assessment & Plan:   Active Problems:   Severe sepsis (HCC)   CAP (community acquired pneumonia)   NSTEMI (non-ST elevated myocardial infarction) ([place])   Hyponatremia   Alcoholic cirrhosis of liver without ascites (HCC)   Duodenitis   Enlarged prostate with urinary retention   AKI (acute kidney injury) ([place])   Diabetes ([place])   At risk for abuse of opiates   Depression   PUD (peptic ulcer disease)  Altered mental status, resolved Likely secondary to polypharmacy/accidental overdose Likely in the setting of recent gabapentin prescription, now back to baseline after cessation Concern for aspiration pneumonia given below Lengthy discussion at bedside about polypharmacy, would recommend outpatient follow-up with pain management specialist given multiple prescriptions from multiple prescribers likely contributing to patient's mental status changes   LLL pneumonia with severe sepsis, POA Vs  noninfectious pneumonitis Hypotension resolved with IV fluid resuscitation -now tolerating p.o. without issue Continue Unasyn -likely transition to Augmentin at discharge for 5-day total course of antibiotics   NSTEMI Likely supply demand mismatch in the setting of above mental status changes and aspiration  History of GI bleed, hold heparin drip  Continue aspirin, Toprol-XL, Imdur and atorvastatin  AKI, resolved In the setting of above, creatinine within normal limits   Duodenitis with history of GI bleed in past when patient was drinking alcohol Change p.o. pantoprazole to IV pantoprazole Heparin being held as noted above   Hyponatremia, resolved Likely hypovolemic in nature, resolved with IV fluids and p.o. intake   DM, insulin-dependent, controlled Decrease glargine from 30 to 15 units Hold metformin SSI AC at bedtime Lab Results  Component Value Date   HGBA1C 8.7 (H) 09/05/2020    Cirrhosis with portal hypertension likely from prior alcohol use, no alcohol use x10 years Patient with previous history of alcohol use per wife No alcohol use in the past 10 years   Hematuria with prostatomegaly with mild bilateral hydroureter Check postvoid residual and place Foley if warranted Continue tamsulosin   H/o "abusing pain meds"  Avoid narcotics   Depression Continue Abilify Continue venlafaxine   COPD No evidence for acute flare Continue inhaled bronchodilators as warranted    DVT prophylaxis: Lovenox Code Status: Full Family Communication: Patient's wife Raelyn Mora updated  Status is: Inpatient  Dispo: The patient is from: Home              Anticipated d/c is to: Home              Anticipated d/c date is:  24 to 48 hours              Patient currently not medically stable for discharge  Consultants:  None  Procedures:  None indicated  Antimicrobials:  Unasyn  Subjective: No acute issues or events overnight, patient's mental status improving, appears  to be close to baseline at this point denies nausea vomiting diarrhea constipation headache fevers or chills.  Objective: Vitals:   09/05/20 2146 09/05/20 2155 09/06/20 0535 09/06/20 0746  BP: (!) 164/85 (!) 164/85 140/75 (!) 143/72  Pulse: 66 74  88  Resp:  18  15  Temp:  98.4 F (36.9 C) 98 F (36.7 C) 98.2 F (36.8 C)  TempSrc:  Oral Oral Oral  SpO2:  99%  95%  Weight:      Height:        Intake/Output Summary (Last 24 hours) at 09/06/2020 0754 Last data filed at 09/06/2020 0747 Gross per 24 hour  Intake 379.64 ml  Output 1300 ml  Net -920.36 ml   Filed Weights   09/04/20 2305  Weight: 83.9 kg    Examination:  General:  Pleasantly resting in bed, No acute distress. HEENT:  Normocephalic atraumatic.  Sclerae nonicteric, noninjected.  Extraocular movements intact bilaterally. Neck:  Without mass or deformity.  Trachea is midline. Lungs:  Clear to auscultate bilaterally without rhonchi, wheeze, or rales. Heart:  Regular rate and rhythm.  Without murmurs, rubs, or gallops. Abdomen:  Soft, nontender, nondistended.  Without guarding or rebound. Extremities: Without cyanosis, clubbing, edema, or obvious deformity. Vascular:  Dorsalis pedis and posterior tibial pulses palpable bilaterally. Skin:  Warm and dry, no erythema, no ulcerations.  Data Reviewed: I have personally reviewed following labs and imaging studies  CBC: Recent Labs  Lab 09/04/20 2143 09/05/20 0500 09/06/20 0358  WBC 11.7* 7.7 5.3  NEUTROABS 9.0* 4.9  --   HGB 12.8* 10.1* 11.5*  HCT 38.4* 30.5* 33.1*  MCV 85.7 87.6 83.8  PLT 213 182 671*   Basic Metabolic Panel: Recent Labs  Lab 09/04/20 2143 09/05/20 0500 09/06/20 0358  NA 133* 133* 136  K 4.6 4.4 3.5  CL 103 105 103  CO2 16* 20* 24  GLUCOSE 209* 135* 105*  BUN 30* 27* 17  CREATININE 1.94* 1.25* 0.85  CALCIUM 9.6 8.6* 8.6*  MG  --  1.5*  --    GFR: Estimated Creatinine Clearance: 76.3 mL/min (by C-G formula based on SCr of 0.85  mg/dL). Liver Function Tests: Recent Labs  Lab 09/04/20 2143 09/05/20 0500  AST 24 26  ALT 13 13  ALKPHOS 68 53  BILITOT 1.5* 2.1*  PROT 6.3* 4.8*  ALBUMIN 3.3* 2.6*   No results for input(s): LIPASE, AMYLASE in the last 168 hours. Recent Labs  Lab 09/05/20 0909  AMMONIA 40*   Coagulation Profile: Recent Labs  Lab 09/04/20 2143  INR 1.2   Cardiac Enzymes: No results for input(s): CKTOTAL, CKMB, CKMBINDEX, TROPONINI in the last 168 hours. BNP (last 3 results) No results for input(s): PROBNP in the last 8760 hours. HbA1C: Recent Labs    09/05/20 1003  HGBA1C 8.7*   CBG: Recent Labs  Lab 09/05/20 1154 09/05/20 1622 09/05/20 2150 09/06/20 0743  GLUCAP 122* 92 123* 93   Lipid Profile: No results for input(s): CHOL, HDL, LDLCALC, TRIG, CHOLHDL, LDLDIRECT in the last 72 hours. Thyroid Function Tests: No results for input(s): TSH, T4TOTAL, FREET4, T3FREE, THYROIDAB in the last 72 hours. Anemia Panel: No results for input(s): VITAMINB12, FOLATE, FERRITIN, TIBC,  IRON, RETICCTPCT in the last 72 hours. Sepsis Labs: Recent Labs  Lab 09/04/20 2330 09/05/20 0402 09/05/20 0500 09/05/20 0548 09/05/20 0705  PROCALCITON  --   --  <0.10  --   --   LATICACIDVEN 5.0* 2.9*  --  2.3* 1.7    Recent Results (from the past 240 hour(s))  Culture, blood (routine x 2)     Status: None (Preliminary result)   Collection Time: 09/04/20  9:56 PM   Specimen: BLOOD  Result Value Ref Range Status   Specimen Description BLOOD LEFT ANTECUBITAL  Final   Special Requests   Final    BOTTLES DRAWN AEROBIC AND ANAEROBIC Blood Culture adequate volume   Culture   Final    NO GROWTH < 12 HOURS Performed at [facility], [place] [address] [place], [place] [address]    Report Status PENDING  Incomplete  Culture, blood (routine x 2)     Status: None (Preliminary result)   Collection Time: 09/04/20 10:07 PM   Specimen: BLOOD  Result Value Ref Range Status   Specimen Description BLOOD  RIGHT ARM  Final   Special Requests   Final    BOTTLES DRAWN AEROBIC AND ANAEROBIC Blood Culture results may not be optimal due to an inadequate volume of blood received in culture bottles   Culture   Final    NO GROWTH < 12 HOURS Performed at [facility], [place] [address] [place], [place] [address]    Report Status PENDING  Incomplete  Resp Panel by RT-PCR (Flu A&B, Covid) Nasopharyngeal Swab     Status: None   Collection Time: 09/04/20 10:58 PM   Specimen: Nasopharyngeal Swab; Nasopharyngeal(NP) swabs in vial transport medium  Result Value Ref Range Status   SARS Coronavirus 2 by RT PCR NEGATIVE NEGATIVE Final    Comment: (NOTE) SARS-CoV-2 target nucleic acids are NOT DETECTED.  The SARS-CoV-2 RNA is generally detectable in upper respiratory specimens during the acute phase of infection. The lowest concentration of SARS-CoV-2 viral copies this assay can detect is 138 copies/mL. A negative result does not preclude SARS-Cov-2 infection and should not be used as the sole basis for treatment or other patient management decisions. A negative result may occur with  improper specimen collection/handling, submission of specimen other than nasopharyngeal swab, presence of viral mutation(s) within the areas targeted by this assay, and inadequate number of viral copies(<138 copies/mL). A negative result must be combined with clinical observations, patient history, and epidemiological information. The expected result is Negative.  Fact Sheet for Patients:  EntrepreneurPulse.com.au  Fact Sheet for Healthcare Providers:  IncredibleEmployment.be  This test is no t yet approved or cleared by the [place] FDA and  has been authorized for detection and/or diagnosis of SARS-CoV-2 by FDA under an Emergency Use Authorization (EUA). This EUA will remain  in effect (meaning this test can be used) for the duration of the COVID-19 declaration under  Section 564(b)(1) of the Act, 21 U.S.C.section 360bbb-3(b)(1), unless the authorization is terminated  or revoked sooner.       Influenza A by PCR NEGATIVE NEGATIVE Final   Influenza B by PCR NEGATIVE NEGATIVE Final    Comment: (NOTE) The Xpert Xpress SARS-CoV-2/FLU/RSV plus assay is intended as an aid in the diagnosis of influenza from Nasopharyngeal swab specimens and should not be used as a sole basis for treatment. Nasal washings and aspirates are unacceptable for Xpert Xpress SARS-CoV-2/FLU/RSV testing.  Fact Sheet for Patients: EntrepreneurPulse.com.au  Fact Sheet for  Healthcare Providers: IncredibleEmployment.be  This test is not yet approved or cleared by the [place] and has been authorized for detection and/or diagnosis of SARS-CoV-2 by FDA under an Emergency Use Authorization (EUA). This EUA will remain in effect (meaning this test can be used) for the duration of the COVID-19 declaration under Section 564(b)(1) of the Act, 21 U.S.C. section 360bbb-3(b)(1), unless the authorization is terminated or revoked.  Performed at [facility], [place] [address] [place],  [address]     Radiology Studies: CT Head Wo Contrast  Result Date: 09/05/2020 CLINICAL DATA:  Mental status change.  Unknown cause. EXAM: CT HEAD WITHOUT CONTRAST TECHNIQUE: Contiguous axial images were obtained from the base of the skull through the vertex without intravenous contrast. COMPARISON:  CT head 08/08/2017 FINDINGS: Brain: Cerebral ventricle sizes are concordant with the degree of cerebral volume loss. Patchy and confluent areas of decreased attenuation are noted throughout the deep and periventricular white matter of the cerebral hemispheres bilaterally, compatible with chronic microvascular ischemic disease. Chronic appearing left cerebellar infarction. No evidence of large-territorial acute infarction. No parenchymal hemorrhage. No mass lesion.  No extra-axial collection. No mass effect or midline shift. No hydrocephalus. Basilar cisterns are patent. Vascular: No hyperdense vessel. Atherosclerotic calcifications are present within the cavernous internal carotid arteries. Skull: No acute fracture or focal lesion. Sinuses/Orbits: Paranasal sinuses and mastoid air cells are clear. Status post lens replacement. Otherwise orbits are unremarkable. Other: None. IMPRESSION: No acute intracranial abnormality. Electronically Signed   By: Iven Finn M.D.   On: 09/05/2020 00:21   CT CHEST ABDOMEN PELVIS W CONTRAST  Result Date: 09/05/2020 CLINICAL DATA:  Attention, shock. Found on back porch unresponsive on the ground. Syncope. GFR 36 EXAM: CT CHEST, ABDOMEN, AND PELVIS WITH CONTRAST TECHNIQUE: Multidetector CT imaging of the chest, abdomen and pelvis was performed following the standard protocol during bolus administration of intravenous contrast. CONTRAST:  27mL OMNIPAQUE IOHEXOL 300 MG/ML  SOLN COMPARISON:  CT abdomen pelvis 08/08/2017 FINDINGS: CHEST: Ports and Devices: None. Lungs/airways: Expiratory phase of respiration. Bilateral lower lobe subsegmental atelectasis. Hazy peribronchovascular ground-glass airspace opacities. Trace debris within the trachea. No focal consolidation. No pulmonary nodule. No pulmonary mass. No pulmonary contusion or laceration. No pneumatocele formation. The central airways are patent. Pleura: No pleural effusion. No pneumothorax. No hemothorax. Lymph Nodes: No mediastinal, hilar, or axillary lymphadenopathy. Mediastinum: No pneumomediastinum. No aortic injury or mediastinal hematoma. The thoracic aorta is normal in caliber. Severe atherosclerotic plaque. The heart is normal in size. No significant pericardial effusion. Four-vessel coronary calcifications with suggestion of coronary artery stents. Myocardial thinning and hypodensity along the left apex changes consistent with prior myocardial infarction. No central pulmonary  embolus. The esophagus is unremarkable. The thyroid is unremarkable. Large hiatal hernia containing the majority of the stomach. Chest Wall / Breasts: No chest wall mass. Musculoskeletal: No acute rib or sternal fracture. No spinal fracture. Multilevel degenerative changes of the spine. Partially visualized anterior cervical surgical hardware. ABDOMEN / PELVIS: Liver: Not enlarged. Slightly nodular hepatic contour. No focal lesion. No laceration or subcapsular hematoma. Biliary System: The gallbladder is otherwise unremarkable with no radio-opaque gallstones. No biliary ductal dilatation. Pancreas: Normal pancreatic contour. No main pancreatic duct dilatation. Spleen: Not enlarged. No focal lesion. No laceration, subcapsular hematoma, or vascular injury. A splenule is noted. Adrenal Glands: No nodularity bilaterally. Kidneys: Bilateral kidneys enhance symmetrically. Redemonstration of multiple fluid density lesions within the kidneys likely representing simple renal cysts. Subcentimeter hypodensities are too small to characterize.  Redemonstration of several bilateral nephrolithiasis measuring up to 5 mm on the right and 7 mm on the left. No hydronephrosis. No contusion, laceration, or subcapsular hematoma. No injury to the vascular structures or collecting systems. Bilateral mild hydroureter. The urinary bladder is distending with urine. There is circumferential urinary bladder wall thickening. Bowel: Circumferential bowel wall thickening and mucosal hyperemia of the duodenum. Irregular walls of the second and third portion of the duodenum with query underlying ulceration. No pneumatosis. No large bowel wall thickening or dilatation. Diffuse colonic diverticulosis. The rectosigmoid colon is under distended. The appendix is unremarkable. Mesentery, Omentum, and Peritoneum: No simple free fluid ascites. No pneumoperitoneum. No hemoperitoneum. No mesenteric hematoma identified. No organized fluid collection. Pelvic  Organs: The prostate is enlarged measuring up to 5 cm. Lymph Nodes: No abdominal, pelvic, inguinal lymphadenopathy. Vasculature: Recanalized paraumbilical vein. Severe atherosclerotic plaque. No abdominal aorta or iliac aneurysm. No active contrast extravasation or pseudoaneurysm. Musculoskeletal: No significant soft tissue hematoma. Tiny fat containing umbilical hernia. Status post posterior and interbody L4-L5 surgical fusion. No acute pelvic fracture. No spinal fracture. Multilevel degenerative changes of the spine. IMPRESSION: 1. Pulmonary findings suggestive of aspiration pneumonia with debris noted within the trachea. 2. Duodenitis with question of ulceration of the second and third portion of the duodenum. No definite bowel ischemia or perforation. 3. Circumferential urinary bladder wall thickening and bilateral mild hydroureter. Findings likely due to obstructive uropathy in the setting of prostatomegaly. Superimposed infection or underlying malignancy not excluded. Correlate with urinalysis. 4. Cirrhosis with portal hypertension. Limited evaluation for hepatic lesions on this single phase contrast study. Recommend nonemergent MRI liver protocol. 5. No acute traumatic injury to the chest, abdomen, or pelvis. 6. No acute fracture or traumatic malalignment of the thoracic or lumbar spine. Other imaging findings of potential clinical significance: 1. Similar-appearing large hiatal hernia containing the majority of the stomach with no findings of bowel obstruction or ischemia. 2. Cholelithiasis no findings of acute cholecystitis. 3. Nonobstructive bilateral nephrolithiasis. 4. Aortic Atherosclerosis (ICD10-I70.0) including four vessel coronary artery calcifications with findings suggestive of prior myocardial infarction. Electronically Signed   By: Iven Finn M.D.   On: 09/05/2020 03:25   DG Chest Portable 1 View  Result Date: 09/04/2020 CLINICAL DATA:  Altered mental status. EXAM: PORTABLE CHEST 1 VIEW  COMPARISON:  Jul 20, 2019 FINDINGS: Mild, diffusely increased lung markings are seen with mild areas of atelectasis and/or infiltrate noted within the mid left lung and left lung base. There is no evidence of a pleural effusion or pneumothorax. The cardiac silhouette is mildly enlarged. Marked severity calcification of the aortic arch is noted. A radiopaque fusion plate and screws are seen overlying the cervical spine. IMPRESSION: Mild left lower lobe atelectasis and/or infiltrate. Electronically Signed   By: Virgina Norfolk M.D.   On: 09/04/2020 23:15    Scheduled Meds:  ARIPiprazole  10 mg Oral q morning   aspirin EC  81 mg Oral Daily   atorvastatin  20 mg Oral Daily   enoxaparin (LOVENOX) injection  40 mg Subcutaneous Q24H   insulin aspart  0-15 Units Subcutaneous TID WC   insulin glargine  15 Units Subcutaneous QPM   isosorbide mononitrate  120 mg Oral Daily   metoprolol succinate  100 mg Oral Daily   pantoprazole (PROTONIX) IV  40 mg Intravenous QHS   tamsulosin  0.4 mg Oral QPC supper   venlafaxine XR  150 mg Oral q morning   Continuous Infusions:  ampicillin-sulbactam (UNASYN) IV 3 g (09/06/20  5035)     LOS: 1 day   Time spent: 62min  Presly Steinruck C Gabriana Wilmott, DO [facility]  If 7PM-7AM, please contact night-coverage [email or web address]  09/06/2020, 7:54 AM

## 2020-09-06 NOTE — Progress Notes (Addendum)
Progress Note  Patient Name: Dustin Welch Date of Encounter: 09/06/2020  Primary Cardiologist: Alfonso Patten (outside facility)  Subjective   Much more awake and lucid this AM. Does not have hearing aids in so please speak up. Denies any preceding chest pain. Has chronic DOE but states no change recently. Denies any hx of syncope. Details of yesterday are unclear to him, does not recall much. He does know he's been taking gabapentin for his back pain the last week or so.  Inpatient Medications    Scheduled Meds:  ARIPiprazole  10 mg Oral q morning   aspirin EC  81 mg Oral Daily   atorvastatin  20 mg Oral Daily   enoxaparin (LOVENOX) injection  40 mg Subcutaneous Q24H   insulin aspart  0-15 Units Subcutaneous TID WC   insulin glargine  15 Units Subcutaneous QPM   isosorbide mononitrate  120 mg Oral Daily   metoprolol succinate  100 mg Oral Daily   pantoprazole (PROTONIX) IV  40 mg Intravenous QHS   tamsulosin  0.4 mg Oral QPC supper   venlafaxine XR  150 mg Oral q morning   Continuous Infusions:  ampicillin-sulbactam (UNASYN) IV 3 g (09/06/20 0313)   PRN Meds: acetaminophen **OR** acetaminophen, ipratropium-albuterol, nitroGLYCERIN   Vital Signs    Vitals:   09/05/20 2146 09/05/20 2155 09/06/20 0535 09/06/20 0746  BP: (!) 164/85 (!) 164/85 140/75 (!) 143/72  Pulse: 66 74  88  Resp:  18  15  Temp:  98.4 F (36.9 C) 98 F (36.7 C) 98.2 F (36.8 C)  TempSrc:  Oral Oral Oral  SpO2:  99%  95%  Weight:      Height:        Intake/Output Summary (Last 24 hours) at 09/06/2020 0815 Last data filed at 09/06/2020 0747 Gross per 24 hour  Intake 379.64 ml  Output 1300 ml  Net -920.36 ml   Last 3 Weights 09/04/2020  Weight (lbs) 185 lb  Weight (kg) 83.915 kg     Telemetry    NSR - Personally Reviewed  ECG    Reviewed yesterday, see consult  Physical Exam   GEN: No acute distress. Lying flat in bed without dyspnea HEENT: Normocephalic, atraumatic, sclera  non-icteric.  Neck: No JVD or bruits. Cardiac: RRR no murmurs, rubs, or gallops.  Respiratory: Clear to auscultation bilaterally. Breathing is unlabored. GI: Soft, nontender, non-distended, BS +x 4. MS: no deformity. Extremities: No clubbing or cyanosis. No edema. Distal pedal pulses are 2+ and equal bilaterally. Neuro:  AAOx3. Follows commands. Psych:  Responds to questions appropriately with a normal affect.  Labs    High Sensitivity Troponin:   Recent Labs  Lab 09/04/20 2143 09/05/20 0046 09/05/20 0500 09/05/20 0909  TROPONINIHS 140* 913* 2,345* 3,196*      Cardiac EnzymesNo results for input(s): TROPONINI in the last 168 hours. No results for input(s): TROPIPOC in the last 168 hours.   Chemistry Recent Labs  Lab 09/04/20 2143 09/05/20 0500 09/06/20 0358  NA 133* 133* 136  K 4.6 4.4 3.5  CL 103 105 103  CO2 16* 20* 24  GLUCOSE 209* 135* 105*  BUN 30* 27* 17  CREATININE 1.94* 1.25* 0.85  CALCIUM 9.6 8.6* 8.6*  PROT 6.3* 4.8*  --   ALBUMIN 3.3* 2.6*  --   AST 24 26  --   ALT 13 13  --   ALKPHOS 68 53  --   BILITOT 1.5* 2.1*  --   GFRNONAA 36* >60 >  [place] '14 8 9     ' Hematology Recent Labs  Lab 09/04/20 2143 09/05/20 0500 09/06/20 0358  WBC 11.7* 7.7 5.3  RBC 4.48 3.48* 3.95*  HGB 12.8* 10.1* 11.5*  HCT 38.4* 30.5* 33.1*  MCV 85.7 87.6 83.8  MCH 28.6 29.0 29.1  MCHC 33.3 33.1 34.7  RDW 14.3 14.5 14.3  PLT 213 182 129*    BNPNo results for input(s): BNP, PROBNP in the last 168 hours.   DDimer No results for input(s): DDIMER in the last 168 hours.   Radiology    CT Head Wo Contrast  Result Date: 09/05/2020 CLINICAL DATA:  Mental status change.  Unknown cause. EXAM: CT HEAD WITHOUT CONTRAST TECHNIQUE: Contiguous axial images were obtained from the base of the skull through the vertex without intravenous contrast. COMPARISON:  CT head 08/08/2017 FINDINGS: Brain: Cerebral ventricle sizes are concordant with the degree of cerebral volume loss.  Patchy and confluent areas of decreased attenuation are noted throughout the deep and periventricular white matter of the cerebral hemispheres bilaterally, compatible with chronic microvascular ischemic disease. Chronic appearing left cerebellar infarction. No evidence of large-territorial acute infarction. No parenchymal hemorrhage. No mass lesion. No extra-axial collection. No mass effect or midline shift. No hydrocephalus. Basilar cisterns are patent. Vascular: No hyperdense vessel. Atherosclerotic calcifications are present within the cavernous internal carotid arteries. Skull: No acute fracture or focal lesion. Sinuses/Orbits: Paranasal sinuses and mastoid air cells are clear. Status post lens replacement. Otherwise orbits are unremarkable. Other: None. IMPRESSION: No acute intracranial abnormality. Electronically Signed   By: Iven Finn M.D.   On: 09/05/2020 00:21   CT CHEST ABDOMEN PELVIS W CONTRAST  Result Date: 09/05/2020 CLINICAL DATA:  Attention, shock. Found on back porch unresponsive on the ground. Syncope. GFR 36 EXAM: CT CHEST, ABDOMEN, AND PELVIS WITH CONTRAST TECHNIQUE: Multidetector CT imaging of the chest, abdomen and pelvis was performed following the standard protocol during bolus administration of intravenous contrast. CONTRAST:  83m OMNIPAQUE IOHEXOL 300 MG/ML  SOLN COMPARISON:  CT abdomen pelvis 08/08/2017 FINDINGS: CHEST: Ports and Devices: None. Lungs/airways: Expiratory phase of respiration. Bilateral lower lobe subsegmental atelectasis. Hazy peribronchovascular ground-glass airspace opacities. Trace debris within the trachea. No focal consolidation. No pulmonary nodule. No pulmonary mass. No pulmonary contusion or laceration. No pneumatocele formation. The central airways are patent. Pleura: No pleural effusion. No pneumothorax. No hemothorax. Lymph Nodes: No mediastinal, hilar, or axillary lymphadenopathy. Mediastinum: No pneumomediastinum. No aortic injury or mediastinal  hematoma. The thoracic aorta is normal in caliber. Severe atherosclerotic plaque. The heart is normal in size. No significant pericardial effusion. Four-vessel coronary calcifications with suggestion of coronary artery stents. Myocardial thinning and hypodensity along the left apex changes consistent with prior myocardial infarction. No central pulmonary embolus. The esophagus is unremarkable. The thyroid is unremarkable. Large hiatal hernia containing the majority of the stomach. Chest Wall / Breasts: No chest wall mass. Musculoskeletal: No acute rib or sternal fracture. No spinal fracture. Multilevel degenerative changes of the spine. Partially visualized anterior cervical surgical hardware. ABDOMEN / PELVIS: Liver: Not enlarged. Slightly nodular hepatic contour. No focal lesion. No laceration or subcapsular hematoma. Biliary System: The gallbladder is otherwise unremarkable with no radio-opaque gallstones. No biliary ductal dilatation. Pancreas: Normal pancreatic contour. No main pancreatic duct dilatation. Spleen: Not enlarged. No focal lesion. No laceration, subcapsular hematoma, or vascular injury. A splenule is noted. Adrenal Glands: No nodularity bilaterally. Kidneys: Bilateral kidneys enhance symmetrically. Redemonstration of multiple fluid density lesions within the kidneys likely representing simple renal  cysts. Subcentimeter hypodensities are too small to characterize. Redemonstration of several bilateral nephrolithiasis measuring up to 5 mm on the right and 7 mm on the left. No hydronephrosis. No contusion, laceration, or subcapsular hematoma. No injury to the vascular structures or collecting systems. Bilateral mild hydroureter. The urinary bladder is distending with urine. There is circumferential urinary bladder wall thickening. Bowel: Circumferential bowel wall thickening and mucosal hyperemia of the duodenum. Irregular walls of the second and third portion of the duodenum with query underlying  ulceration. No pneumatosis. No large bowel wall thickening or dilatation. Diffuse colonic diverticulosis. The rectosigmoid colon is under distended. The appendix is unremarkable. Mesentery, Omentum, and Peritoneum: No simple free fluid ascites. No pneumoperitoneum. No hemoperitoneum. No mesenteric hematoma identified. No organized fluid collection. Pelvic Organs: The prostate is enlarged measuring up to 5 cm. Lymph Nodes: No abdominal, pelvic, inguinal lymphadenopathy. Vasculature: Recanalized paraumbilical vein. Severe atherosclerotic plaque. No abdominal aorta or iliac aneurysm. No active contrast extravasation or pseudoaneurysm. Musculoskeletal: No significant soft tissue hematoma. Tiny fat containing umbilical hernia. Status post posterior and interbody L4-L5 surgical fusion. No acute pelvic fracture. No spinal fracture. Multilevel degenerative changes of the spine. IMPRESSION: 1. Pulmonary findings suggestive of aspiration pneumonia with debris noted within the trachea. 2. Duodenitis with question of ulceration of the second and third portion of the duodenum. No definite bowel ischemia or perforation. 3. Circumferential urinary bladder wall thickening and bilateral mild hydroureter. Findings likely due to obstructive uropathy in the setting of prostatomegaly. Superimposed infection or underlying malignancy not excluded. Correlate with urinalysis. 4. Cirrhosis with portal hypertension. Limited evaluation for hepatic lesions on this single phase contrast study. Recommend nonemergent MRI liver protocol. 5. No acute traumatic injury to the chest, abdomen, or pelvis. 6. No acute fracture or traumatic malalignment of the thoracic or lumbar spine. Other imaging findings of potential clinical significance: 1. Similar-appearing large hiatal hernia containing the majority of the stomach with no findings of bowel obstruction or ischemia. 2. Cholelithiasis no findings of acute cholecystitis. 3. Nonobstructive bilateral  nephrolithiasis. 4. Aortic Atherosclerosis (ICD10-I70.0) including four vessel coronary artery calcifications with findings suggestive of prior myocardial infarction. Electronically Signed   By: Iven Finn M.D.   On: 09/05/2020 03:25   DG Chest Portable 1 View  Result Date: 09/04/2020 CLINICAL DATA:  Altered mental status. EXAM: PORTABLE CHEST 1 VIEW COMPARISON:  Jul 20, 2019 FINDINGS: Mild, diffusely increased lung markings are seen with mild areas of atelectasis and/or infiltrate noted within the mid left lung and left lung base. There is no evidence of a pleural effusion or pneumothorax. The cardiac silhouette is mildly enlarged. Marked severity calcification of the aortic arch is noted. A radiopaque fusion plate and screws are seen overlying the cervical spine. IMPRESSION: Mild left lower lobe atelectasis and/or infiltrate. Electronically Signed   By: Virgina Norfolk M.D.   On: 09/04/2020 23:15    Cardiac Studies   2D echo - CareEverywhere 01/2018 Findings  Mitral Valve  Moderate mitral annular calcification.  Mild mitral regurgitation.  Aortic Valve  There is moderate aortic sclerosis noted, with no evidence of stenosis.  No aortic stenosis. No aortic regurgitation.  Mean transaortic gradient (Doppler) 6  Tricuspid Valve  Structurally normal tricuspid valve with no stenosis.  Pulmonic Valve  Normal pulmonic valve structure and mobility.  Left Atrium  Moderately dilated left atrium.  Left atrial volume index of 37.6 ml per meters squared BSA.  Left Ventricle  Ejection fraction is visually estimated at 55-60%  Mild left ventricular  hypertrophy  Right Atrium  Normal right atrium.  Right Ventricle  Normal right ventricle structure and function.  Pericardial Effusion  No evidence of pericardial effusion.  Pleural Effusion  No evidence of pleural effusion.  Miscellaneous  The aorta is within normal limits.  The Pulmonary artery is within normal limits.  Intact interatrial  septum with no obvious shunt by color doppler.  IVC is normal and collapses   Cardiac Cath - CareEverywhere 05/2019 Severe calcification fluoroscopically.   Mild segmental LV dysfunction  Incomplete expansion of LAD chronic stent;  iFR .81  CTO of prox RCA  Nonobstructive prox circ disease   PCI:  High pressure POBA with scoring balloon.  Post POBA iFR improved to   .91, angiographic improvement however residual remains 30%   Coronary Findings Diagnostic Dominance: Right  Left Anterior Descending: Prox LAD to Mid LAD lesion is 60% stenosed. Culprit lesion. Lesion length: 6 mm. TIMI flow is 3. The lesion is type C. The lesion is moderately calcified. The lesion is not chronically occluded. The lesion was previously treated using a stent. Stent delivery was done by way of self expansion. Previous treatment took place 1-2 years ago. The lesion has in-stent restenosis. No thrombosis in the previous stent. The stenosis was measured by a visual reading. Pressure wire/FFR was performed on the lesion. FFR: 0.81.  Left Circumflex: Ost Cx to Prox Cx lesion is 40% stenosed. Not the culprit lesion. Lesion length: 10 mm. TIMI flow is 3. The lesion is type C and irregular. The lesion is severely calcified.  Right Coronary Artery: Prox RCA to Mid RCA lesion is 100% stenosed with 0% stenosed side branch in Acute Mrg. Not the culprit lesion. TIMI flow is 0. The lesion is calcified. The lesion is chronically occluded. Right Posterior Descending Artery: RPDA filled by collaterals from Dist LAD.   Intervention  Prox LAD to Mid LAD lesion: Angioplasty: Angioplasty using a scoring balloon was performed independent of stent deployment. Maximum pressure: 22 atm. Post-Intervention Lesion Assessment: The intervention was successful. Intentional subintimal strategy was not used. Embolic protection device was not deployed. The guidewire was not threaded through a graft to reach the lesion. The guidewire crossed the lesion.  Device was not deployed. Post-intervention TIMI flow is 3. Lesion had 6 mm of its length treated. There were no complications. Pressure wire/FFR was not measured. There is a 30% residual stenosis post intervention.   Left Ventricle  The left ventricular size is normal. There is mild left ventricular systolic dysfunction. LV end diastolic pressure is elevated. There are wall motion abnormalities in the left ventricle.   Mitral Valve  There is no mitral valve stenosis.   Physician Attestation  I personally performed the entire procedure. I have personally interpreted the angiograms. I also attest that all medications given and all point of care testing performed during the procedure were ordered by me.   Wall Motion  The mid inferior, basilar anterior, basilar inferior, apical anterior andapical inferior segments are normal. The mid anterior segment is hypokinetic.  2D echo here PENDING  Patient Profile     70 y.o. male with CAD with prior stenting, HTN, HLD, sleep apnea not previously compliant with CPAP, mild MR by echo 2019, iron deficiency anemia, tobacco use, diabetes, prior ETOH use (not current), reported hx of GI bleeding. Also hx of abusing pain medicine per hospitalist note. He is followed at outside facility with [facility] reporting prior PCI at [facility] of Cx/OM1 in 2016, NSTEMI 10/2016 s/p DES  to LAD, cath 10/2018 s/p DES to prox RCA with unsuccessful PCI to [facility], cath 05/2019 with PTCA to LAD stent with 30% residual stenosis and PTCA Attempt to the diagonal / "wire probably subintimal at calcified plaque." Last cath is fully outlined below with residual disease treated medically with mild LV systolic dysfunction but EF not specifically reported with hypokinesis of mid anterior segment. (Last echo was in 2019 with mild MR and normal EF.)  Admitted with unresponsiveness, severe hypotension and acute encephalopathy, possibly felt related to gabapentin overdue. Lab derangement noted with  lactic acidosis, AKI, anemia, hypoalbuminemia, hyponatremia. Abd CT showed aspiration PNA and duodenitis amongst other findings. Cardiology consulted for elevated troponin.  Assessment & Plan    1. Altered mental status with severe hypotension/sepsis physiology and LLL PNA - multiple medical issues included lactic acidosis, hypoalbuminemia, acute kidney injury, normocytic anemia, abnormal bilirubin, hypothermia, imaging concerning for aspiration PNA, duodenitis and possible cirrhosis amongst other ancillary findings - further management per primary team - possible syncope reported per ED notes - not clear to me if this term was just used to describe decreased consciousness upon being found or had full LOC - this was the presenting issue   2. Elevated troponin/possible NSTEMI with known history of CAD as outlined above - patient has known substrate for demand ischemia with underlying CAD - rising troponin noted: 140->913->2345->3196, will follow up another value now to peak - clinical presentation is not consistent solely with ACS therefore suspect this is a secondary process although does convey higher risk of underlying morbidity/mortality and potential for progression in known CAD - heparin discontinued 7/2 due to IM concern for duodenitis on CT scan and prior hx GIB - now back on ASA, BB, statin, Imdur - 2D echo pending - will discuss eval with MD  3. Acute kidney injury - resolved, Cr now 0.85  4. Anemia/thrombocytopenia - Hgb stable at 11.5 compared to yesterday, but platelet count declined to 129 - per IM   5. Hyperlipidemia - statin restarted, can f/u primary cardiologist for dosing (keep in mind question of cirrhosis on CT so would not titrate today)   6. H/o LV dysfunction - mild segmental LV dysfunction noted by cath 05/2019, await echocardiogram  For questions or updates, please contact [place] Please consult [email or web address] for contact info under  Cardiology/STEMI.  Signed, Charlie Pitter, PA-C 09/06/2020, 8:15 AM    History and all data above reviewed.  Patient examined.  I agree with the findings as above.  He is alert and able to give info today and tells me that he has had no recent acute cardiac complaints. The patient exam reveals COR:RRR  ,  Lungs: Clear  ,  Abd: Positive bowel sounds, no rebound no guarding, Ext   No edema .  All available labs, radiology testing, previous records reviewed. Agree with documented assessment and plan. Elevated troponin:  I will wait for the echo and if there are no new obvious new findings I will not suggest further work up.  He is anxious to be discharged tomorrow to get to a spine appt on Tuesday.    Jeneen Rinks Brooksie Ellwanger  10:59 AM  09/06/2020

## 2020-09-06 NOTE — Progress Notes (Signed)
Echocardiogram 2D Echocardiogram has been performed.  Oneal Deputy Zyon Grout RDCS 09/06/2020, 1:00 PM

## 2020-09-07 DIAGNOSIS — E871 Hypo-osmolality and hyponatremia: Secondary | ICD-10-CM

## 2020-09-07 DIAGNOSIS — Z9189 Other specified personal risk factors, not elsewhere classified: Secondary | ICD-10-CM

## 2020-09-07 DIAGNOSIS — K2980 Duodenitis without bleeding: Secondary | ICD-10-CM

## 2020-09-07 DIAGNOSIS — R4182 Altered mental status, unspecified: Secondary | ICD-10-CM

## 2020-09-07 DIAGNOSIS — Z794 Long term (current) use of insulin: Secondary | ICD-10-CM

## 2020-09-07 DIAGNOSIS — R799 Abnormal finding of blood chemistry, unspecified: Secondary | ICD-10-CM

## 2020-09-07 DIAGNOSIS — K7030 Alcoholic cirrhosis of liver without ascites: Secondary | ICD-10-CM

## 2020-09-07 DIAGNOSIS — A419 Sepsis, unspecified organism: Secondary | ICD-10-CM

## 2020-09-07 DIAGNOSIS — E1169 Type 2 diabetes mellitus with other specified complication: Secondary | ICD-10-CM

## 2020-09-07 DIAGNOSIS — R6520 Severe sepsis without septic shock: Secondary | ICD-10-CM

## 2020-09-07 DIAGNOSIS — K279 Peptic ulcer, site unspecified, unspecified as acute or chronic, without hemorrhage or perforation: Secondary | ICD-10-CM

## 2020-09-07 LAB — CBC
HCT: 31.1 % — ABNORMAL LOW (ref 39.0–52.0)
Hemoglobin: 10.3 g/dL — ABNORMAL LOW (ref 13.0–17.0)
MCH: 28.4 pg (ref 26.0–34.0)
MCHC: 33.1 g/dL (ref 30.0–36.0)
MCV: 85.7 fL (ref 80.0–100.0)
Platelets: 145 10*3/uL — ABNORMAL LOW (ref 150–400)
RBC: 3.63 MIL/uL — ABNORMAL LOW (ref 4.22–5.81)
RDW: 13.8 % (ref 11.5–15.5)
WBC: 4.6 10*3/uL (ref 4.0–10.5)
nRBC: 0 % (ref 0.0–0.2)

## 2020-09-07 LAB — BASIC METABOLIC PANEL
Anion gap: 6 (ref 5–15)
BUN: 16 mg/dL (ref 8–23)
CO2: 25 mmol/L (ref 22–32)
Calcium: 8.6 mg/dL — ABNORMAL LOW (ref 8.9–10.3)
Chloride: 106 mmol/L (ref 98–111)
Creatinine, Ser: 0.92 mg/dL (ref 0.61–1.24)
GFR, Estimated: >60 mL/min (ref >60)
Glucose, Bld: 103 mg/dL — ABNORMAL HIGH (ref 70–99)
Potassium: 3.2 mmol/L — ABNORMAL LOW (ref 3.5–5.1)
Sodium: 137 mmol/L (ref 135–145)

## 2020-09-07 LAB — GLUCOSE, CAPILLARY
Glucose-Capillary: 217 mg/dL — ABNORMAL HIGH (ref 70–99)
Glucose-Capillary: 149 mg/dL — ABNORMAL HIGH (ref 70–99)

## 2020-09-07 MED ORDER — PANTOPRAZOLE SODIUM 40 MG PO TBEC: 40.0000 mg | DELAYED_RELEASE_TABLET | Freq: Every day | ORAL | 0 refills | Status: DC

## 2020-09-07 MED ORDER — AMOXICILLIN-POT CLAVULANATE 875-125 MG PO TABS: 1.0000 | ORAL_TABLET | Freq: Two times a day (BID) | ORAL | 0 refills | Status: AC

## 2020-09-07 MED ORDER — PANTOPRAZOLE SODIUM 40 MG PO TBEC: 40.0000 mg | DELAYED_RELEASE_TABLET | Freq: Every day | ORAL | Status: DC

## 2020-09-07 NOTE — Discharge Summary (Signed)
Physician Discharge Summary  Dustin Welch CWU:[id number] DOB: Jul 30, 1950 DOA: 09/04/2020  PCP: Dustin Flow, MD  Admit date: 09/04/2020 Discharge date: 09/07/2020  Admitted From: Home Disposition: Home  Recommendations for Outpatient Follow-up:  Follow up with PCP in 1-2 weeks Please obtain BMP/CBC in one week  Home Health: None Equipment/Devices: None  Discharge Condition: Stable CODE STATUS: Full Diet recommendation: Low-salt low-fat diet  Brief/Interim Summary: Dustin Welch is an 70 y.o. male with history of hypertension, DM, CAD S/P MI and LAD stent, PUD w h/o GI bleed >10 years ago, history of alcohol use none x10 years, history of "pain medicine abuse" was in his usual state of health until 4 days ago when he received new prescription of gabapentin for treatment of chronic LBP.  Patient's wife noted that he was somnolent and somewhat confused however over the last 2 days patient was severely confused, combative and yesterday was not acting correctly. Patient confused and combative in the ED with imaging concerning for left lower lobe pneumonia concerning for aspiration during mental status changes. Patient was treated with IV fluid resuscitation, started on vancomycin and cefepime for severe sepsis thought to be secondary to CAP possible aspiration pneumonia.  He was also started on heparin drip given rising troponin.  Patient admitted as above with acute metabolic encephalopathy in the setting of polypharmacy, notably started on gabapentin just prior to his onset of mental status changes.  After discontinuation of gabapentin patient mental status drastically improved.  At intake there was concern for left lower lobe opacification and pneumonia and he did meet criteria for severe sepsis given vital signs and hypoxia.  At this time patient's mental status is back to baseline otherwise stable and agreeable for discharge on p.o. antibiotics to treat likely aspiration pneumonia versus  pneumonitis.  Of note patient did have transient episode of elevated troponin likely supply demand mismatch in the setting of aspiration, hypotension with overdose and mental status changes.  Cardiology following, given reassuring echo downtrending troponin and EKG he is otherwise stable and agreeable for discharge from their standpoint.  We discussed the patient will need close follow-up with PCP in the next 1 to 2 weeks, cardiology follow-up as needed at this point.  Discharge Diagnoses:  Active Problems:   Severe sepsis ([place])   CAP (community acquired pneumonia)   NSTEMI (non-ST elevated myocardial infarction) ([place])   Hyponatremia   Alcoholic cirrhosis of liver without ascites ([facility])   Duodenitis   Enlarged prostate with urinary retention   AKI (acute kidney injury) ([place])   Diabetes ([place])   At risk for abuse of opiates   Depression   PUD (peptic ulcer disease)    Discharge Instructions  Discharge Instructions     Call MD for:  extreme fatigue   Complete by: As directed    Call MD for:  persistant dizziness or light-headedness   Complete by: As directed    Diet - low sodium heart healthy   Complete by: As directed    Diet Carb Modified   Complete by: As directed    Increase activity slowly   Complete by: As directed       Allergies as of 09/07/2020   Not on File      Medication List     TAKE these medications    Accu-Chek Guide test strip Generic drug: glucose blood 1 each by Other route in the morning, at noon, and at bedtime.   albuterol 108 (90 Base) MCG/ACT inhaler Commonly known as: VENTOLIN  HFA Inhale 2 puffs into the lungs every 6 (six) hours as needed for wheezing or shortness of breath (cough).   amoxicillin-clavulanate 875-125 MG tablet Commonly known as: Augmentin Take 1 tablet by mouth 2 (two) times daily for 4 days.   ARIPiprazole 10 MG tablet Commonly known as: ABILIFY Take 10 mg by mouth every morning.   aspirin 81 MG EC tablet Take 81 mg  by mouth daily.   atorvastatin 40 MG tablet Commonly known as: LIPITOR Take 20 mg by mouth daily.   ipratropium-albuterol 0.5-2.5 (3) MG/3ML Soln Commonly known as: DUONEB Take 3 mLs by nebulization every 6 (six) hours as needed (breathing).   isosorbide mononitrate 120 MG 24 hr tablet Commonly known as: IMDUR Take 120 mg by mouth daily.   Lantus SoloStar 100 UNIT/ML Solostar Pen Generic drug: insulin glargine Inject 30 Units into the skin every evening.   metFORMIN 500 MG (MOD) 24 hr tablet Commonly known as: GLUMETZA Take 1,000 mg by mouth daily with breakfast.   metoprolol succinate 100 MG 24 hr tablet Commonly known as: TOPROL-XL Take 100 mg by mouth daily.   nitroGLYCERIN 0.4 MG SL tablet Commonly known as: NITROSTAT Place 0.4 mg under the tongue every 5 (five) minutes as needed for chest pain.   pantoprazole 40 MG tablet Commonly known as: PROTONIX Take 1 tablet (40 mg total) by mouth at bedtime. What changed:  medication strength how much to take when to take this   tamsulosin 0.4 MG Caps capsule Commonly known as: FLOMAX Take 0.4 mg by mouth in the morning and at bedtime.   venlafaxine XR 150 MG 24 hr capsule Commonly known as: EFFEXOR-XR Take 150 mg by mouth every morning.        Not on File  Consultations: Cardiology  Procedures/Studies: CT Head Wo Contrast  Result Date: 09/05/2020 CLINICAL DATA:  Mental status change.  Unknown cause. EXAM: CT HEAD WITHOUT CONTRAST TECHNIQUE: Contiguous axial images were obtained from the base of the skull through the vertex without intravenous contrast. COMPARISON:  CT head 08/08/2017 FINDINGS: Brain: Cerebral ventricle sizes are concordant with the degree of cerebral volume loss. Patchy and confluent areas of decreased attenuation are noted throughout the deep and periventricular white matter of the cerebral hemispheres bilaterally, compatible with chronic microvascular ischemic disease. Chronic appearing left  cerebellar infarction. No evidence of large-territorial acute infarction. No parenchymal hemorrhage. No mass lesion. No extra-axial collection. No mass effect or midline shift. No hydrocephalus. Basilar cisterns are patent. Vascular: No hyperdense vessel. Atherosclerotic calcifications are present within the cavernous internal carotid arteries. Skull: No acute fracture or focal lesion. Sinuses/Orbits: Paranasal sinuses and mastoid air cells are clear. Status post lens replacement. Otherwise orbits are unremarkable. Other: None. IMPRESSION: No acute intracranial abnormality. Electronically Signed   By: Iven Finn M.D.   On: 09/05/2020 00:21   CT CHEST ABDOMEN PELVIS W CONTRAST  Result Date: 09/05/2020 CLINICAL DATA:  Attention, shock. Found on back porch unresponsive on the ground. Syncope. GFR 36 EXAM: CT CHEST, ABDOMEN, AND PELVIS WITH CONTRAST TECHNIQUE: Multidetector CT imaging of the chest, abdomen and pelvis was performed following the standard protocol during bolus administration of intravenous contrast. CONTRAST:  73mL OMNIPAQUE IOHEXOL 300 MG/ML  SOLN COMPARISON:  CT abdomen pelvis 08/08/2017 FINDINGS: CHEST: Ports and Devices: None. Lungs/airways: Expiratory phase of respiration. Bilateral lower lobe subsegmental atelectasis. Hazy peribronchovascular ground-glass airspace opacities. Trace debris within the trachea. No focal consolidation. No pulmonary nodule. No pulmonary mass. No pulmonary contusion or laceration. No pneumatocele  formation. The central airways are patent. Pleura: No pleural effusion. No pneumothorax. No hemothorax. Lymph Nodes: No mediastinal, hilar, or axillary lymphadenopathy. Mediastinum: No pneumomediastinum. No aortic injury or mediastinal hematoma. The thoracic aorta is normal in caliber. Severe atherosclerotic plaque. The heart is normal in size. No significant pericardial effusion. Four-vessel coronary calcifications with suggestion of coronary artery stents. Myocardial  thinning and hypodensity along the left apex changes consistent with prior myocardial infarction. No central pulmonary embolus. The esophagus is unremarkable. The thyroid is unremarkable. Large hiatal hernia containing the majority of the stomach. Chest Wall / Breasts: No chest wall mass. Musculoskeletal: No acute rib or sternal fracture. No spinal fracture. Multilevel degenerative changes of the spine. Partially visualized anterior cervical surgical hardware. ABDOMEN / PELVIS: Liver: Not enlarged. Slightly nodular hepatic contour. No focal lesion. No laceration or subcapsular hematoma. Biliary System: The gallbladder is otherwise unremarkable with no radio-opaque gallstones. No biliary ductal dilatation. Pancreas: Normal pancreatic contour. No main pancreatic duct dilatation. Spleen: Not enlarged. No focal lesion. No laceration, subcapsular hematoma, or vascular injury. A splenule is noted. Adrenal Glands: No nodularity bilaterally. Kidneys: Bilateral kidneys enhance symmetrically. Redemonstration of multiple fluid density lesions within the kidneys likely representing simple renal cysts. Subcentimeter hypodensities are too small to characterize. Redemonstration of several bilateral nephrolithiasis measuring up to 5 mm on the right and 7 mm on the left. No hydronephrosis. No contusion, laceration, or subcapsular hematoma. No injury to the vascular structures or collecting systems. Bilateral mild hydroureter. The urinary bladder is distending with urine. There is circumferential urinary bladder wall thickening. Bowel: Circumferential bowel wall thickening and mucosal hyperemia of the duodenum. Irregular walls of the second and third portion of the duodenum with query underlying ulceration. No pneumatosis. No large bowel wall thickening or dilatation. Diffuse colonic diverticulosis. The rectosigmoid colon is under distended. The appendix is unremarkable. Mesentery, Omentum, and Peritoneum: No simple free fluid  ascites. No pneumoperitoneum. No hemoperitoneum. No mesenteric hematoma identified. No organized fluid collection. Pelvic Organs: The prostate is enlarged measuring up to 5 cm. Lymph Nodes: No abdominal, pelvic, inguinal lymphadenopathy. Vasculature: Recanalized paraumbilical vein. Severe atherosclerotic plaque. No abdominal aorta or iliac aneurysm. No active contrast extravasation or pseudoaneurysm. Musculoskeletal: No significant soft tissue hematoma. Tiny fat containing umbilical hernia. Status post posterior and interbody L4-L5 surgical fusion. No acute pelvic fracture. No spinal fracture. Multilevel degenerative changes of the spine. IMPRESSION: 1. Pulmonary findings suggestive of aspiration pneumonia with debris noted within the trachea. 2. Duodenitis with question of ulceration of the second and third portion of the duodenum. No definite bowel ischemia or perforation. 3. Circumferential urinary bladder wall thickening and bilateral mild hydroureter. Findings likely due to obstructive uropathy in the setting of prostatomegaly. Superimposed infection or underlying malignancy not excluded. Correlate with urinalysis. 4. Cirrhosis with portal hypertension. Limited evaluation for hepatic lesions on this single phase contrast study. Recommend nonemergent MRI liver protocol. 5. No acute traumatic injury to the chest, abdomen, or pelvis. 6. No acute fracture or traumatic malalignment of the thoracic or lumbar spine. Other imaging findings of potential clinical significance: 1. Similar-appearing large hiatal hernia containing the majority of the stomach with no findings of bowel obstruction or ischemia. 2. Cholelithiasis no findings of acute cholecystitis. 3. Nonobstructive bilateral nephrolithiasis. 4. Aortic Atherosclerosis (ICD10-I70.0) including four vessel coronary artery calcifications with findings suggestive of prior myocardial infarction. Electronically Signed   By: Iven Finn M.D.   On: 09/05/2020 03:25    DG Chest Portable 1 View  Result Date: 09/04/2020 CLINICAL  DATA:  Altered mental status. EXAM: PORTABLE CHEST 1 VIEW COMPARISON:  Jul 20, 2019 FINDINGS: Mild, diffusely increased lung markings are seen with mild areas of atelectasis and/or infiltrate noted within the mid left lung and left lung base. There is no evidence of a pleural effusion or pneumothorax. The cardiac silhouette is mildly enlarged. Marked severity calcification of the aortic arch is noted. A radiopaque fusion plate and screws are seen overlying the cervical spine. IMPRESSION: Mild left lower lobe atelectasis and/or infiltrate. Electronically Signed   By: Virgina Norfolk M.D.   On: 09/04/2020 23:15   ECHOCARDIOGRAM COMPLETE  Result Date: 09/06/2020    ECHOCARDIOGRAM REPORT   Patient Name:   Dustin Welch Date of Exam: 09/06/2020 Medical Rec #:  [id number]         Height:       63.0 in Accession #:    [id number]        Weight:       185.0 lb Date of Birth:  09/25/1950         BSA:          1.871 m Patient Age:    14 years          BP:           143/72 mmHg Patient Gender: M                 HR:           86 bpm. Exam Location:  Inpatient Procedure: 2D Echo, Color Doppler and Cardiac Doppler Indications:    Elevated Troponins  History:        Patient has prior history of Echocardiogram examinations, most                 recent 01/17/2018. Risk Factors:Hypertension, Diabetes,                 Dyslipidemia and Sleep Apnea. Prior performed at [facility]:    [place] Referring Phys: [phone] [place]  1. Left ventricular ejection fraction, by estimation, is 55 to 60%. The left ventricle has normal function. The left ventricle has no regional wall motion abnormalities. There is moderate left ventricular hypertrophy. Left ventricular diastolic parameters are consistent with Grade I diastolic dysfunction (impaired relaxation). Elevated left atrial pressure.  2. Right ventricular systolic function is  normal. The right ventricular size is normal.  3. The mitral valve is normal in structure. No evidence of mitral valve regurgitation. No evidence of mitral stenosis.  4. The aortic valve is tricuspid. Aortic valve regurgitation is not visualized. Mild aortic valve sclerosis is present, with no evidence of aortic valve stenosis.  5. Aortic dilatation noted. There is mild dilatation of the ascending aorta, measuring 39 mm.  6. The inferior vena cava is normal in size with greater than 50% respiratory variability, suggesting right atrial pressure of 3 mmHg. FINDINGS  Left Ventricle: Left ventricular ejection fraction, by estimation, is 55 to 60%. The left ventricle has normal function. The left ventricle has no regional wall motion abnormalities. The left ventricular internal cavity size was normal in size. There is  moderate left ventricular hypertrophy. Left ventricular diastolic parameters are consistent with Grade I diastolic dysfunction (impaired relaxation). Elevated left atrial pressure. Right Ventricle: The right ventricular size is normal.Right ventricular systolic function is normal. Left Atrium: Left atrial size was normal in size. Right Atrium: Right atrial size was normal in size. Pericardium: There is no evidence of pericardial  effusion. Mitral Valve: The mitral valve is normal in structure. No evidence of mitral valve regurgitation. No evidence of mitral valve stenosis. Tricuspid Valve: The tricuspid valve is normal in structure. Tricuspid valve regurgitation is not demonstrated. No evidence of tricuspid stenosis. Aortic Valve: The aortic valve is tricuspid. Aortic valve regurgitation is not visualized. Mild aortic valve sclerosis is present, with no evidence of aortic valve stenosis. Pulmonic Valve: The pulmonic valve was normal in structure. Pulmonic valve regurgitation is not visualized. No evidence of pulmonic stenosis. Aorta: Aortic dilatation noted. There is mild dilatation of the ascending aorta,  measuring 39 mm. Venous: The inferior vena cava is normal in size with greater than 50% respiratory variability, suggesting right atrial pressure of 3 mmHg. IAS/Shunts: The interatrial septum was not well visualized.  LEFT VENTRICLE PLAX 2D LVIDd:         3.70 cm  Diastology LVIDs:         2.60 cm  LV e' medial:    3.15 cm/s LV PW:         1.30 cm  LV E/e' medial:  24.4 LV IVS:        1.50 cm  LV e' lateral:   4.57 cm/s LVOT diam:     1.90 cm  LV E/e' lateral: 16.8 LV SV:         49 LV SV Index:   26 LVOT Area:     2.84 cm  RIGHT VENTRICLE RV S prime:     9.68 cm/s TAPSE (M-mode): 1.8 cm LEFT ATRIUM             Index       RIGHT ATRIUM           Index LA diam:        2.60 cm 1.39 cm/m  RA Area:     11.40 cm LA Vol (A2C):   26.5 ml 14.17 ml/m RA Volume:   20.30 ml  10.85 ml/m LA Vol (A4C):   58.2 ml 31.11 ml/m LA Biplane Vol: 42.5 ml 22.72 ml/m  AORTIC VALVE LVOT Vmax:   87.00 cm/s LVOT Vmean:  66.100 cm/s LVOT VTI:    0.174 m  AORTA Ao Root diam: 3.50 cm Ao Asc diam:  3.90 cm MITRAL VALVE MV Area (PHT): 2.27 cm     SHUNTS MV Decel Time: 334 msec     Systemic VTI:  0.17 m MV E velocity: 77.00 cm/s   Systemic Diam: 1.90 cm MV A velocity: 107.00 cm/s MV E/A ratio:  0.72 Kirk Ruths MD Electronically signed by Kirk Ruths MD Signature Date/Time: 09/06/2020/1:45:21 PM    Final      Subjective: No acute issues or events overnight back to baseline mental status denies nausea vomiting diarrhea constipation headache fevers chills chest pain shortness of breath   Discharge Exam: Vitals:   09/07/20 0458 09/07/20 0808  BP: 138/71 138/88  Pulse: 62 77  Resp: 18 14  Temp: 99 F (37.2 C) 98.8 F (37.1 C)  SpO2:  94%   Vitals:   09/06/20 1600 09/06/20 1925 09/07/20 0458 09/07/20 0808  BP: 114/69 123/71 138/71 138/88  Pulse: 75 68 62 77  Resp: 20 14 18 14   Temp: 98.4 F (36.9 C) 99.2 F (37.3 C) 99 F (37.2 C) 98.8 F (37.1 C)  TempSrc: Oral Oral Oral Oral  SpO2: 97% 97%  94%  Weight:       Height:        General: Pt is alert, awake,  not in acute distress Cardiovascular: RRR, S1/S2 +, no rubs, no gallops Respiratory: CTA bilaterally, no wheezing, no rhonchi Abdominal: Soft, NT, ND, bowel sounds + Extremities: no edema, no cyanosis    The results of significant diagnostics from this hospitalization (including imaging, microbiology, ancillary and laboratory) are listed below for reference.     Microbiology: Recent Results (from the past 240 hour(s))  Culture, blood (routine x 2)     Status: None (Preliminary result)   Collection Time: 09/04/20  9:56 PM   Specimen: BLOOD  Result Value Ref Range Status   Specimen Description BLOOD LEFT ANTECUBITAL  Final   Special Requests   Final    BOTTLES DRAWN AEROBIC AND ANAEROBIC Blood Culture adequate volume   Culture   Final    NO GROWTH 3 DAYS Performed at [facility], [address], [place], [place] [address]    Report Status PENDING  Incomplete  Culture, blood (routine x 2)     Status: None (Preliminary result)   Collection Time: 09/04/20 10:07 PM   Specimen: BLOOD  Result Value Ref Range Status   Specimen Description BLOOD RIGHT ARM  Final   Special Requests   Final    BOTTLES DRAWN AEROBIC AND ANAEROBIC Blood Culture results may not be optimal due to an inadequate volume of blood received in culture bottles   Culture   Final    NO GROWTH 3 DAYS Performed at [facility], [place] [address], [place], [place] [address]    Report Status PENDING  Incomplete  Resp Panel by RT-PCR (Flu A&B, Covid) Nasopharyngeal Swab     Status: None   Collection Time: 09/04/20 10:58 PM   Specimen: Nasopharyngeal Swab; Nasopharyngeal(NP) swabs in vial transport medium  Result Value Ref Range Status   SARS Coronavirus 2 by RT PCR NEGATIVE NEGATIVE Final    Comment: (NOTE) SARS-CoV-2 target nucleic acids are NOT DETECTED.  The SARS-CoV-2 RNA is generally detectable in upper respiratory specimens during the acute phase of  infection. The lowest concentration of SARS-CoV-2 viral copies this assay can detect is 138 copies/mL. A negative result does not preclude SARS-Cov-2 infection and should not be used as the sole basis for treatment or other patient management decisions. A negative result may occur with  improper specimen collection/handling, submission of specimen other than nasopharyngeal swab, presence of viral mutation(s) within the areas targeted by this assay, and inadequate number of viral copies(<138 copies/mL). A negative result must be combined with clinical observations, patient history, and epidemiological information. The expected result is Negative.  Fact Sheet for Patients:  EntrepreneurPulse.com.au  Fact Sheet for Healthcare Providers:  IncredibleEmployment.be  This test is no t yet approved or cleared by the [place] FDA and  has been authorized for detection and/or diagnosis of SARS-CoV-2 by FDA under an Emergency Use Authorization (EUA). This EUA will remain  in effect (meaning this test can be used) for the duration of the COVID-19 declaration under Section 564(b)(1) of the Act, 21 U.S.C.section 360bbb-3(b)(1), unless the authorization is terminated  or revoked sooner.       Influenza A by PCR NEGATIVE NEGATIVE Final   Influenza B by PCR NEGATIVE NEGATIVE Final    Comment: (NOTE) The Xpert Xpress SARS-CoV-2/FLU/RSV plus assay is intended as an aid in the diagnosis of influenza from Nasopharyngeal swab specimens and should not be used as a sole basis for treatment. Nasal washings and aspirates are unacceptable for Xpert Xpress SARS-CoV-2/FLU/RSV testing.  Fact Sheet for Patients:  EntrepreneurPulse.com.au  Fact Sheet for Healthcare Providers: IncredibleEmployment.be  This test is not yet approved or cleared by the [place] FDA and has been authorized for detection and/or diagnosis of SARS-CoV-2  by FDA under an Emergency Use Authorization (EUA). This EUA will remain in effect (meaning this test can be used) for the duration of the COVID-19 declaration under Section 564(b)(1) of the Act, 21 U.S.C. section 360bbb-3(b)(1), unless the authorization is terminated or revoked.  Performed at [facility], [place] [address], [place], [place] [address]   Urine culture     Status: None   Collection Time: 09/05/20  3:17 AM   Specimen: Urine, Random  Result Value Ref Range Status   Specimen Description URINE, RANDOM  Final   Special Requests NONE  Final   Culture   Final    NO GROWTH Performed at [facility], [place] [address] [place], [place] [address]    Report Status 09/06/2020 FINAL  Final     Labs: BNP (last 3 results) No results for input(s): BNP in the last 8760 hours. Basic Metabolic Panel: Recent Labs  Lab 09/04/20 2143 09/05/20 0500 09/06/20 0358 09/07/20 0406  NA 133* 133* 136 137  K 4.6 4.4 3.5 3.2*  CL 103 105 103 106  CO2 16* 20* 24 25  GLUCOSE 209* 135* 105* 103*  BUN 30* 27* 17 16  CREATININE 1.94* 1.25* 0.85 0.92  CALCIUM 9.6 8.6* 8.6* 8.6*  MG  --  1.5*  --   --    Liver Function Tests: Recent Labs  Lab 09/04/20 2143 09/05/20 0500  AST 24 26  ALT 13 13  ALKPHOS 68 53  BILITOT 1.5* 2.1*  PROT 6.3* 4.8*  ALBUMIN 3.3* 2.6*   No results for input(s): LIPASE, AMYLASE in the last 168 hours. Recent Labs  Lab 09/05/20 0909  AMMONIA 40*   CBC: Recent Labs  Lab 09/04/20 2143 09/05/20 0500 09/06/20 0358 09/07/20 0406  WBC 11.7* 7.7 5.3 4.6  NEUTROABS 9.0* 4.9  --   --   HGB 12.8* 10.1* 11.5* 10.3*  HCT 38.4* 30.5* 33.1* 31.1*  MCV 85.7 87.6 83.8 85.7  PLT 213 182 129* 145*   Cardiac Enzymes: No results for input(s): CKTOTAL, CKMB, CKMBINDEX, TROPONINI in the last 168 hours. BNP: Invalid input(s): POCBNP CBG: Recent Labs  Lab 09/06/20 0743 09/06/20 1303 09/06/20 1558 09/06/20 2109 09/07/20 0809  GLUCAP 93 140* 148* 152*  217*   D-Dimer No results for input(s): DDIMER in the last 72 hours. Hgb A1c Recent Labs    09/05/20 1003  HGBA1C 8.7*   Lipid Profile No results for input(s): CHOL, HDL, LDLCALC, TRIG, CHOLHDL, LDLDIRECT in the last 72 hours. Thyroid function studies No results for input(s): TSH, T4TOTAL, T3FREE, THYROIDAB in the last 72 hours.  Invalid input(s): FREET3 Anemia work up No results for input(s): VITAMINB12, FOLATE, FERRITIN, TIBC, IRON, RETICCTPCT in the last 72 hours. Urinalysis    Component Value Date/Time   COLORURINE YELLOW 09/05/2020 0317   APPEARANCEUR HAZY (A) 09/05/2020 0317   LABSPEC 1.016 09/05/2020 0317   PHURINE 5.0 09/05/2020 0317   GLUCOSEU 50 (A) 09/05/2020 0317   HGBUR LARGE (A) 09/05/2020 0317   BILIRUBINUR NEGATIVE 09/05/2020 0317   KETONESUR NEGATIVE 09/05/2020 0317   PROTEINUR 30 (A) 09/05/2020 0317   NITRITE NEGATIVE 09/05/2020 0317   LEUKOCYTESUR NEGATIVE 09/05/2020 0317   Sepsis Labs Invalid input(s): PROCALCITONIN,  WBC,  [facility] Micro[facility] Recent Results (from the past 240 hour(s))  Culture,  blood (routine x 2)     Status: None (Preliminary result)   Collection Time: 09/04/20  9:56 PM   Specimen: BLOOD  Result Value Ref Range Status   Specimen Description BLOOD LEFT ANTECUBITAL  Final   Special Requests   Final    BOTTLES DRAWN AEROBIC AND ANAEROBIC Blood Culture adequate volume   Culture   Final    NO GROWTH 3 DAYS Performed at [facility], [address], [place], [place] [address]    Report Status PENDING  Incomplete  Culture, blood (routine x 2)     Status: None (Preliminary result)   Collection Time: 09/04/20 10:07 PM   Specimen: BLOOD  Result Value Ref Range Status   Specimen Description BLOOD RIGHT ARM  Final   Special Requests   Final    BOTTLES DRAWN AEROBIC AND ANAEROBIC Blood Culture results may not be optimal due to an inadequate volume of blood received in culture bottles   Culture   Final    NO GROWTH 3  DAYS Performed at [facility], [place] [address] [place], [place] [address]    Report Status PENDING  Incomplete  Resp Panel by RT-PCR (Flu A&B, Covid) Nasopharyngeal Swab     Status: None   Collection Time: 09/04/20 10:58 PM   Specimen: Nasopharyngeal Swab; Nasopharyngeal(NP) swabs in vial transport medium  Result Value Ref Range Status   SARS Coronavirus 2 by RT PCR NEGATIVE NEGATIVE Final    Comment: (NOTE) SARS-CoV-2 target nucleic acids are NOT DETECTED.  The SARS-CoV-2 RNA is generally detectable in upper respiratory specimens during the acute phase of infection. The lowest concentration of SARS-CoV-2 viral copies this assay can detect is 138 copies/mL. A negative result does not preclude SARS-Cov-2 infection and should not be used as the sole basis for treatment or other patient management decisions. A negative result may occur with  improper specimen collection/handling, submission of specimen other than nasopharyngeal swab, presence of viral mutation(s) within the areas targeted by this assay, and inadequate number of viral copies(<138 copies/mL). A negative result must be combined with clinical observations, patient history, and epidemiological information. The expected result is Negative.  Fact Sheet for Patients:  EntrepreneurPulse.com.au  Fact Sheet for Healthcare Providers:  IncredibleEmployment.be  This test is no t yet approved or cleared by the [place] FDA and  has been authorized for detection and/or diagnosis of SARS-CoV-2 by FDA under an Emergency Use Authorization (EUA). This EUA will remain  in effect (meaning this test can be used) for the duration of the COVID-19 declaration under Section 564(b)(1) of the Act, 21 U.S.C.section 360bbb-3(b)(1), unless the authorization is terminated  or revoked sooner.       Influenza A by PCR NEGATIVE NEGATIVE Final   Influenza B by PCR NEGATIVE NEGATIVE Final    Comment:  (NOTE) The Xpert Xpress SARS-CoV-2/FLU/RSV plus assay is intended as an aid in the diagnosis of influenza from Nasopharyngeal swab specimens and should not be used as a sole basis for treatment. Nasal washings and aspirates are unacceptable for Xpert Xpress SARS-CoV-2/FLU/RSV testing.  Fact Sheet for Patients: EntrepreneurPulse.com.au  Fact Sheet for Healthcare Providers: IncredibleEmployment.be  This test is not yet approved or cleared by the [place] FDA and has been authorized for detection and/or diagnosis of SARS-CoV-2 by FDA under an Emergency Use Authorization (EUA). This EUA will remain in effect (meaning this test can be used) for the duration of the COVID-19 declaration under Section 564(b)(1) of the Act, 21  U.S.C. section 360bbb-3(b)(1), unless the authorization is terminated or revoked.  Performed at [facility], [place] [address] [place], [place] [address]   Urine culture     Status: None   Collection Time: 09/05/20  3:17 AM   Specimen: Urine, Random  Result Value Ref Range Status   Specimen Description URINE, RANDOM  Final   Special Requests NONE  Final   Culture   Final    NO GROWTH Performed at [facility], [place] [address] [place], [place] [address]    Report Status 09/06/2020 FINAL  Final     Time coordinating discharge: Over 30 minutes  SIGNED:   Little Ishikawa, DO [facility] 09/07/2020, 10:02 AM Pager   If 7PM-7AM, please contact night-coverage [email or web address]

## 2020-09-07 NOTE — Progress Notes (Signed)
Progress Note  Patient Name: Dustin Welch Date of Encounter: 09/07/2020  Primary Cardiologist: Alfonso Patten (outside facility)  Subjective  Pt denies CP  Breathing is OK   Inpatient Medications    Scheduled Meds:  ARIPiprazole  10 mg Oral q morning   aspirin EC  81 mg Oral Daily   atorvastatin  20 mg Oral Daily   enoxaparin (LOVENOX) injection  40 mg Subcutaneous Q24H   insulin aspart  0-15 Units Subcutaneous TID WC   insulin glargine  15 Units Subcutaneous QPM   isosorbide mononitrate  120 mg Oral Daily   metoprolol succinate  100 mg Oral Daily   pantoprazole (PROTONIX) IV  40 mg Intravenous QHS   tamsulosin  0.4 mg Oral QPC supper   venlafaxine XR  150 mg Oral q morning   Continuous Infusions:  ampicillin-sulbactam (UNASYN) IV 3 g (09/07/20 0252)   PRN Meds: acetaminophen **OR** acetaminophen, ipratropium-albuterol, nitroGLYCERIN   Vital Signs    Vitals:   09/06/20 1600 09/06/20 1925 09/07/20 0458 09/07/20 0808  BP: 114/69 123/71 138/71 138/88  Pulse: 75 68 62 77  Resp: '20 14 18 14  ' Temp: 98.4 F (36.9 C) 99.2 F (37.3 C) 99 F (37.2 C) 98.8 F (37.1 C)  TempSrc: Oral Oral Oral Oral  SpO2: 97% 97%  94%  Weight:      Height:        Intake/Output Summary (Last 24 hours) at 09/07/2020 0830 Last data filed at 09/07/2020 0400 Gross per 24 hour  Intake 400 ml  Output 400 ml  Net 0 ml   Last 3 Weights 09/04/2020  Weight (lbs) 185 lb  Weight (kg) 83.915 kg     Telemetry    NSR - Personally Reviewed  ECG    Reviewed yesterday, see consult  Physical Exam   GEN: No acute distress. Lying flat in bed without dyspne  Neck: No JVD Cardiac: RRR no murmurs Respiratory: Clear to auscultation bilaterally. Breathing is unlabored. GI: Soft, nontender, non-distended, BS +x 4. MS: no deformity. Extremities: No clubbing or cyanosis. No edema. Distal pedal pulses are 2+ and equal bilaterally. Neuro:  AAOx3. Follows commands. Psych:  Responds to  questions appropriately with a normal affect.  Labs    High Sensitivity Troponin:   Recent Labs  Lab 09/04/20 2143 09/05/20 0046 09/05/20 0500 09/05/20 0909 09/06/20 0834  TROPONINIHS 140* 913* 2,345* 3,196* 1,566*      Cardiac EnzymesNo results for input(s): TROPONINI in the last 168 hours. No results for input(s): TROPIPOC in the last 168 hours.   Chemistry Recent Labs  Lab 09/04/20 2143 09/05/20 0500 09/06/20 0358 09/07/20 0406  NA 133* 133* 136 137  K 4.6 4.4 3.5 3.2*  CL 103 105 103 106  CO2 16* 20* 24 25  GLUCOSE 209* 135* 105* 103*  BUN 30* 27* 17 16  CREATININE 1.94* 1.25* 0.85 0.92  CALCIUM 9.6 8.6* 8.6* 8.6*  PROT 6.3* 4.8*  --   --   ALBUMIN 3.3* 2.6*  --   --   AST 24 26  --   --   ALT 13 13  --   --   ALKPHOS 68 53  --   --   BILITOT 1.5* 2.1*  --   --   GFRNONAA 36* >60 >60 >60  ANIONGAP '14 8 9 6     ' Hematology Recent Labs  Lab 09/05/20 0500 09/06/20 0358 09/07/20 0406  WBC 7.7 5.3 4.6  RBC 3.48* 3.95* 3.63*  HGB  10.1* 11.5* 10.3*  HCT 30.5* 33.1* 31.1*  MCV 87.6 83.8 85.7  MCH 29.0 29.1 28.4  MCHC 33.1 34.7 33.1  RDW 14.5 14.3 13.8  PLT 182 129* 145*    BNPNo results for input(s): BNP, PROBNP in the last 168 hours.   DDimer No results for input(s): DDIMER in the last 168 hours.   Radiology    ECHOCARDIOGRAM COMPLETE  Result Date: 09/06/2020    ECHOCARDIOGRAM REPORT   Patient Name:   Dustin Welch Date of Exam: 09/06/2020 Medical Rec #:  [id number]         Height:       63.0 in Accession #:    [id number]        Weight:       185.0 lb Date of Birth:  Jul 25, 1950         BSA:          1.871 m Patient Age:    70 years          BP:           143/72 mmHg Patient Gender: M                 HR:           86 bpm. Exam Location:  Inpatient Procedure: 2D Echo, Color Doppler and Cardiac Doppler Indications:    Elevated Troponins  History:        Patient has prior history of Echocardiogram examinations, most                 recent 01/17/2018. Risk  Factors:Hypertension, Diabetes,                 Dyslipidemia and Sleep Apnea. Prior performed at [facility]:    [place] Referring Phys: [id number] [place]  1. Left ventricular ejection fraction, by estimation, is 55 to 60%. The left ventricle has normal function. The left ventricle has no regional wall motion abnormalities. There is moderate left ventricular hypertrophy. Left ventricular diastolic parameters are consistent with Grade I diastolic dysfunction (impaired relaxation). Elevated left atrial pressure.  2. Right ventricular systolic function is normal. The right ventricular size is normal.  3. The mitral valve is normal in structure. No evidence of mitral valve regurgitation. No evidence of mitral stenosis.  4. The aortic valve is tricuspid. Aortic valve regurgitation is not visualized. Mild aortic valve sclerosis is present, with no evidence of aortic valve stenosis.  5. Aortic dilatation noted. There is mild dilatation of the ascending aorta, measuring 39 mm.  6. The inferior vena cava is normal in size with greater than 50% respiratory variability, suggesting right atrial pressure of 3 mmHg. FINDINGS  Left Ventricle: Left ventricular ejection fraction, by estimation, is 55 to 60%. The left ventricle has normal function. The left ventricle has no regional wall motion abnormalities. The left ventricular internal cavity size was normal in size. There is  moderate left ventricular hypertrophy. Left ventricular diastolic parameters are consistent with Grade I diastolic dysfunction (impaired relaxation). Elevated left atrial pressure. Right Ventricle: The right ventricular size is normal.Right ventricular systolic function is normal. Left Atrium: Left atrial size was normal in size. Right Atrium: Right atrial size was normal in size. Pericardium: There is no evidence of pericardial effusion. Mitral Valve: The mitral valve is normal in structure. No evidence of  mitral valve regurgitation. No evidence of mitral valve stenosis. Tricuspid Valve: The tricuspid valve is normal in structure. Tricuspid valve regurgitation  is not demonstrated. No evidence of tricuspid stenosis. Aortic Valve: The aortic valve is tricuspid. Aortic valve regurgitation is not visualized. Mild aortic valve sclerosis is present, with no evidence of aortic valve stenosis. Pulmonic Valve: The pulmonic valve was normal in structure. Pulmonic valve regurgitation is not visualized. No evidence of pulmonic stenosis. Aorta: Aortic dilatation noted. There is mild dilatation of the ascending aorta, measuring 39 mm. Venous: The inferior vena cava is normal in size with greater than 50% respiratory variability, suggesting right atrial pressure of 3 mmHg. IAS/Shunts: The interatrial septum was not well visualized.  LEFT VENTRICLE PLAX 2D LVIDd:         3.70 cm  Diastology LVIDs:         2.60 cm  LV e' medial:    3.15 cm/s LV PW:         1.30 cm  LV E/e' medial:  24.4 LV IVS:        1.50 cm  LV e' lateral:   4.57 cm/s LVOT diam:     1.90 cm  LV E/e' lateral: 16.8 LV SV:         49 LV SV Index:   26 LVOT Area:     2.84 cm  RIGHT VENTRICLE RV S prime:     9.68 cm/s TAPSE (M-mode): 1.8 cm LEFT ATRIUM             Index       RIGHT ATRIUM           Index LA diam:        2.60 cm 1.39 cm/m  RA Area:     11.40 cm LA Vol (A2C):   26.5 ml 14.17 ml/m RA Volume:   20.30 ml  10.85 ml/m LA Vol (A4C):   58.2 ml 31.11 ml/m LA Biplane Vol: 42.5 ml 22.72 ml/m  AORTIC VALVE LVOT Vmax:   87.00 cm/s LVOT Vmean:  66.100 cm/s LVOT VTI:    0.174 m  AORTA Ao Root diam: 3.50 cm Ao Asc diam:  3.90 cm MITRAL VALVE MV Area (PHT): 2.27 cm     SHUNTS MV Decel Time: 334 msec     Systemic VTI:  0.17 m MV E velocity: 77.00 cm/s   Systemic Diam: 1.90 cm MV A velocity: 107.00 cm/s MV E/A ratio:  0.72 Kirk Ruths MD Electronically signed by Kirk Ruths MD Signature Date/Time: 09/06/2020/1:45:21 PM    Final     Cardiac Studies   2D echo  -   JUly 3,2022  2D echo  1. Left ventricular ejection fraction, by estimation, is 55 to 60%. The left ventricle has normal function. The left ventricle has no regional wall motion abnormalities. There is moderate left ventricular hypertrophy. Left ventricular diastolic parameters are consistent with Grade I diastolic dysfunction (impaired relaxation). Elevated left atrial pressure. 2. Right ventricular systolic function is normal. The right ventricular size is normal. 3. The mitral valve is normal in structure. No evidence of mitral valve regurgitation. No evidence of mitral stenosis. 4. The aortic valve is tricuspid. Aortic valve regurgitation is not visualized. Mild aortic valve sclerosis is present, with no evidence of aortic valve stenosis. 5. Aortic dilatation noted. There is mild dilatation of the ascending aorta, measuring 39 mm. 6. The inferior vena cava is normal in size with greater than 50% respiratory variability, suggesting right atrial pressure of 3 mmHg.  Echo  (CareEverywhere )01/2018 Findings  Mitral Valve  Mild mitral regurgitation.  Moderately dilated left atrium.  Left Ventricle  Ejection fraction is visually estimated at 55-60%  Mild left ventricular hypertrophy  Right Ventricle  Normal right ventricle structure and function.  Pleural Effusion  No evidence of pleural effusion. The Pulmonary artery is within normal limits.  Intact interatrial septum with no obvious shunt by color doppler.  IVC is normal and collapses   Cardiac Cath - CareEverywhere 05/2019 Severe calcification fluoroscopically.   Mild segmental LV dysfunction  Incomplete expansion of LAD chronic stent;  iFR .81  CTO of prox RCA  Nonobstructive prox circ disease   PCI:  High pressure POBA with scoring balloon.  Post POBA iFR improved to   .91, angiographic improvement however residual remains 30%   Coronary Findings Diagnostic Dominance: Right  Left Anterior Descending: Prox LAD to  Mid LAD lesion is 60% stenosed. Culprit lesion. Lesion length: 6 mm. TIMI flow is 3. The lesion is type C. The lesion is moderately calcified. The lesion is not chronically occluded. The lesion was previously treated using a stent. Stent delivery was done by way of self expansion. Previous treatment took place 1-2 years ago. The lesion has in-stent restenosis. No thrombosis in the previous stent. The stenosis was measured by a visual reading. Pressure wire/FFR was performed on the lesion. FFR: 0.81.  Left Circumflex: Ost Cx to Prox Cx lesion is 40% stenosed. Not the culprit lesion. Lesion length: 10 mm. TIMI flow is 3. The lesion is type C and irregular. The lesion is severely calcified.  Right Coronary Artery: Prox RCA to Mid RCA lesion is 100% stenosed with 0% stenosed side branch in Acute Mrg. Not the culprit lesion. TIMI flow is 0. The lesion is calcified. The lesion is chronically occluded. Right Posterior Descending Artery: RPDA filled by collaterals from Dist LAD.   Intervention  Prox LAD to Mid LAD lesion: Angioplasty: Angioplasty using a scoring balloon was performed independent of stent deployment. Maximum pressure: 22 atm. Post-Intervention Lesion Assessment: The intervention was successful. Intentional subintimal strategy was not used. Embolic protection device was not deployed. The guidewire was not threaded through a graft to reach the lesion. The guidewire crossed the lesion. Device was not deployed. Post-intervention TIMI flow is 3. Lesion had 6 mm of its length treated. There were no complications. Pressure wire/FFR was not measured. There is a 30% residual stenosis post intervention.   Left Ventricle  The left ventricular size is normal. There is mild left ventricular systolic dysfunction. LV end diastolic pressure is elevated. There are wall motion abnormalities in the left ventricle.   Mitral Valve  There is no mitral valve stenosis.   Physician Attestation  I personally performed  the entire procedure. I have personally interpreted the angiograms. I also attest that all medications given and all point of care testing performed during the procedure were ordered by me.   Wall Motion  The mid inferior, basilar anterior, basilar inferior, apical anterior andapical inferior segments are normal. The mid anterior segment is hypokinetic.  Patient Profile     70 y.o. male with CAD with prior stenting, HTN, HLD, sleep apnea not previously compliant with CPAP, mild MR by echo 2019, iron deficiency anemia, tobacco use, diabetes, prior ETOH use (not current), reported hx of GI bleeding. Also hx of abusing pain medicine   Hx of CAD with  PCI at [facility] of Cx/OM1 in 2016, NSTEMI 10/2016 s/p DES to LAD, cath 10/2018 s/p DES to prox RCA with unsuccessful PCI to [facility], cath 05/2019 with PTCA to LAD stent with 30% residual stenosis and PTCA  Attempt to the diagonal / "wire probably subintimal at calcified plaque."   Residual disease treated medically with mild LV systolic dysfunction but EF not specifically reported with hypokinesis of mid anterior segment. (Last echo was in 2019 with mild MR and normal EF.)  Pt admitted with unresponsiveness, severe hypotension and acute encephalopathy, possibly felt related to gabapentin overdose   Lab derangement noted with lactic acidosis, AKI, anemia, hypoalbuminemia, hyponatremia. Abd CT showed aspiration PNA and duodenitis amongst other findings. Cardiology consulted for elevated troponin.  Assessment & Plan        Elevated troponin/possible NSTEMI with known history of CAD as outlined above - patient has known substrate for demand ischemia with underlying CAD - rising troponin noted: 140->913->2345->3196, 1566    - clinical presentation is not consistent solely with ACS.  Suspect demand in setting of known CAD   - heparin discontinued 7/2 due to IM concern for duodenitis on CT scan and prior hx GIB - now back on ASA, BB, statin, Imdur Echo above     [place]  recomm continued medical Rx   OK to d/c today    . Altered mental status with severe hypotension/sepsis physiology and LLL PNA - multiple medical issues included lactic acidosis, hypoalbuminemia, acute kidney injury, normocytic anemia, abnormal bilirubin, hypothermia, imaging concerning for aspiration PNA, duodenitis and possible cirrhosis amongst other ancillary findings - further management per primary team - possible syncope reported per ED notes - not clear to me if this term was just used to describe decreased consciousness upon being found or had full LOC - this was the presenting issue  3    Hyperlipidemia - statin restarted, can f/u primary cardiologist for dosing \    For questions or updates, please contact [place] Please consult [email or web address] for contact info under Cardiology/STEMI.  Signed, Dorris Carnes, MD 09/07/2020, 8:30 AM

## 2020-09-08 DIAGNOSIS — M48061 Spinal stenosis, lumbar region without neurogenic claudication: Secondary | ICD-10-CM | POA: Diagnosis not present

## 2020-09-09 LAB — CULTURE, BLOOD (ROUTINE X 2)
Culture: NO GROWTH
Culture: NO GROWTH
Special Requests: ADEQUATE

## 2020-09-18 DIAGNOSIS — M4326 Fusion of spine, lumbar region: Secondary | ICD-10-CM | POA: Diagnosis not present

## 2020-09-18 DIAGNOSIS — M5416 Radiculopathy, lumbar region: Secondary | ICD-10-CM | POA: Diagnosis not present

## 2020-09-18 DIAGNOSIS — M48061 Spinal stenosis, lumbar region without neurogenic claudication: Secondary | ICD-10-CM | POA: Diagnosis not present

## 2020-09-18 DIAGNOSIS — Z79899 Other long term (current) drug therapy: Secondary | ICD-10-CM | POA: Diagnosis not present

## 2020-09-30 DIAGNOSIS — M5416 Radiculopathy, lumbar region: Secondary | ICD-10-CM | POA: Diagnosis not present

## 2020-10-08 DIAGNOSIS — K449 Diaphragmatic hernia without obstruction or gangrene: Secondary | ICD-10-CM | POA: Diagnosis not present

## 2020-10-08 DIAGNOSIS — I2699 Other pulmonary embolism without acute cor pulmonale: Secondary | ICD-10-CM | POA: Diagnosis not present

## 2020-10-08 DIAGNOSIS — R072 Precordial pain: Secondary | ICD-10-CM | POA: Diagnosis not present

## 2020-10-08 DIAGNOSIS — I2694 Multiple subsegmental pulmonary emboli without acute cor pulmonale: Secondary | ICD-10-CM | POA: Diagnosis not present

## 2020-10-08 DIAGNOSIS — R079 Chest pain, unspecified: Secondary | ICD-10-CM | POA: Diagnosis not present

## 2020-10-09 DIAGNOSIS — E119 Type 2 diabetes mellitus without complications: Secondary | ICD-10-CM | POA: Diagnosis not present

## 2020-10-09 DIAGNOSIS — R072 Precordial pain: Secondary | ICD-10-CM | POA: Diagnosis not present

## 2020-10-09 DIAGNOSIS — I252 Old myocardial infarction: Secondary | ICD-10-CM | POA: Diagnosis not present

## 2020-10-09 DIAGNOSIS — I2510 Atherosclerotic heart disease of native coronary artery without angina pectoris: Secondary | ICD-10-CM | POA: Diagnosis present

## 2020-10-09 DIAGNOSIS — Z85828 Personal history of other malignant neoplasm of skin: Secondary | ICD-10-CM | POA: Diagnosis not present

## 2020-10-09 DIAGNOSIS — K7460 Unspecified cirrhosis of liver: Secondary | ICD-10-CM | POA: Diagnosis not present

## 2020-10-09 DIAGNOSIS — E1149 Type 2 diabetes mellitus with other diabetic neurological complication: Secondary | ICD-10-CM | POA: Diagnosis present

## 2020-10-09 DIAGNOSIS — I249 Acute ischemic heart disease, unspecified: Secondary | ICD-10-CM | POA: Diagnosis present

## 2020-10-09 DIAGNOSIS — R001 Bradycardia, unspecified: Secondary | ICD-10-CM | POA: Diagnosis present

## 2020-10-09 DIAGNOSIS — Z79899 Other long term (current) drug therapy: Secondary | ICD-10-CM | POA: Diagnosis not present

## 2020-10-09 DIAGNOSIS — K449 Diaphragmatic hernia without obstruction or gangrene: Secondary | ICD-10-CM | POA: Diagnosis not present

## 2020-10-09 DIAGNOSIS — R079 Chest pain, unspecified: Secondary | ICD-10-CM | POA: Diagnosis not present

## 2020-10-09 DIAGNOSIS — M1990 Unspecified osteoarthritis, unspecified site: Secondary | ICD-10-CM | POA: Diagnosis present

## 2020-10-09 DIAGNOSIS — F419 Anxiety disorder, unspecified: Secondary | ICD-10-CM | POA: Diagnosis present

## 2020-10-09 DIAGNOSIS — Z20822 Contact with and (suspected) exposure to covid-19: Secondary | ICD-10-CM | POA: Diagnosis present

## 2020-10-09 DIAGNOSIS — E785 Hyperlipidemia, unspecified: Secondary | ICD-10-CM | POA: Diagnosis not present

## 2020-10-09 DIAGNOSIS — Z7982 Long term (current) use of aspirin: Secondary | ICD-10-CM | POA: Diagnosis not present

## 2020-10-09 DIAGNOSIS — I2694 Multiple subsegmental pulmonary emboli without acute cor pulmonale: Secondary | ICD-10-CM | POA: Diagnosis not present

## 2020-10-09 DIAGNOSIS — Z7984 Long term (current) use of oral hypoglycemic drugs: Secondary | ICD-10-CM | POA: Diagnosis not present

## 2020-10-09 DIAGNOSIS — F17210 Nicotine dependence, cigarettes, uncomplicated: Secondary | ICD-10-CM | POA: Diagnosis present

## 2020-10-09 DIAGNOSIS — K8020 Calculus of gallbladder without cholecystitis without obstruction: Secondary | ICD-10-CM | POA: Diagnosis not present

## 2020-10-09 DIAGNOSIS — F32A Depression, unspecified: Secondary | ICD-10-CM | POA: Diagnosis present

## 2020-10-09 DIAGNOSIS — Z7901 Long term (current) use of anticoagulants: Secondary | ICD-10-CM | POA: Diagnosis not present

## 2020-10-09 DIAGNOSIS — I214 Non-ST elevation (NSTEMI) myocardial infarction: Secondary | ICD-10-CM | POA: Diagnosis present

## 2020-10-09 DIAGNOSIS — Z6824 Body mass index (BMI) 24.0-24.9, adult: Secondary | ICD-10-CM | POA: Diagnosis not present

## 2020-10-09 DIAGNOSIS — J449 Chronic obstructive pulmonary disease, unspecified: Secondary | ICD-10-CM | POA: Diagnosis present

## 2020-10-09 DIAGNOSIS — Z955 Presence of coronary angioplasty implant and graft: Secondary | ICD-10-CM | POA: Diagnosis not present

## 2020-10-09 DIAGNOSIS — I10 Essential (primary) hypertension: Secondary | ICD-10-CM | POA: Diagnosis present

## 2020-10-09 DIAGNOSIS — I2699 Other pulmonary embolism without acute cor pulmonale: Secondary | ICD-10-CM | POA: Diagnosis not present

## 2020-10-09 DIAGNOSIS — R634 Abnormal weight loss: Secondary | ICD-10-CM | POA: Diagnosis present

## 2020-10-09 DIAGNOSIS — K219 Gastro-esophageal reflux disease without esophagitis: Secondary | ICD-10-CM | POA: Diagnosis not present

## 2020-10-10 DIAGNOSIS — R978 Other abnormal tumor markers: Secondary | ICD-10-CM | POA: Insufficient documentation

## 2020-10-14 DIAGNOSIS — Z6827 Body mass index (BMI) 27.0-27.9, adult: Secondary | ICD-10-CM | POA: Diagnosis not present

## 2020-10-14 DIAGNOSIS — M5416 Radiculopathy, lumbar region: Secondary | ICD-10-CM | POA: Diagnosis not present

## 2020-10-14 DIAGNOSIS — M4326 Fusion of spine, lumbar region: Secondary | ICD-10-CM | POA: Diagnosis not present

## 2020-10-14 DIAGNOSIS — I10 Essential (primary) hypertension: Secondary | ICD-10-CM | POA: Diagnosis not present

## 2020-10-27 DIAGNOSIS — Z6823 Body mass index (BMI) 23.0-23.9, adult: Secondary | ICD-10-CM | POA: Diagnosis not present

## 2020-10-27 DIAGNOSIS — I2699 Other pulmonary embolism without acute cor pulmonale: Secondary | ICD-10-CM | POA: Diagnosis not present

## 2020-10-27 DIAGNOSIS — M5441 Lumbago with sciatica, right side: Secondary | ICD-10-CM | POA: Diagnosis not present

## 2020-10-28 ENCOUNTER — Telehealth: Payer: Self-pay | Admitting: Oncology

## 2020-10-28 NOTE — Telephone Encounter (Signed)
Inpatient Referral by Dr Rosemarie Beath for PE.  Appt made 11/11/20 Consult 3:00 pm

## 2020-11-10 NOTE — Progress Notes (Signed)
[facility]  [address] [place],  [place]  [address] [phone]  Clinic Day:  11/11/2020  Referring physician: Mateo Flow, MD  This document serves as a record of services personally performed by Hosie Poisson, MD. It was created on their behalf by [facility], a trained medical scribe. The creation of this record is based on the scribe's personal observations and the provider's statements to them.  CHIEF COMPLAINT:  CC: Bilateral pulmonary emboli  Current Treatment:  Eliquis 5 mg BID   HISTORY OF PRESENT ILLNESS:  Dustin Welch is a 70 y.o. male referred by Dr. Armandina Stammer for the evaluation and treatment of pulmonary embolism.  He presented to the [facility] emergency department on August 5th due to abrupt onset substernal chest pain while at rest.  The patient has had previous cardiac trouble and so took nitroglycerin x3, but the pain did not improve.  CTA chest revealed small bilateral segmental pulmonary emboli.  He also developed a NSTEMI during this admission, and so was taken to the intensive care unit and placed on IV Heparin.  He was discharged on August 7th on Eliquis 5 mg BID.  Lab work up at that time revealed a hemoglobin of 11.1 with an MCV of 83.  He was previously on oral iron supplement, but has not taken this lately.  INTERVAL HISTORY:  Dustin Welch denies any chest pain currently. He has had some shortness of breath, which is long standing. He has had cardiac stent placement x 6. He was on Plavix until March. He continues Eliquis 5 mg BID as prescribed. He has a smoking history of 50 pack years, but has cut back and is down to 1/2 a pack per day. He notes leg pain which radiates down to his feet. He has had mild lower extremity edema, right worse than left. Bilateral Doppler ultrasound has been negative.  He reports right lower back pain. He has had prior back surgery and has received 2 injections. He reports increased urinary  frequency, dysuria, and nocturia. I will order a UA and culture for further evaluation. He denies any family history of thrombosis.  His brother did have lymphoma but he denies any other significant family history. He does have a history of skin cancer, and has been seen by Loyal Jacobson, dermatology, in the past. His  appetite is poor, and he is eating as well as he can.  He has lost some weight over the past few months.  He denies fever, chills or other signs of infection.  He denies nausea, vomiting, bowel issues, or abdominal pain.  He denies sore throat, cough, dyspnea, or chest pain.  REVIEW OF SYSTEMS:  Review of Systems  Constitutional: Negative.  Negative for appetite change, chills, fatigue, fever and unexpected weight change.  HENT:  Negative.    Eyes: Negative.   Respiratory:  Positive for shortness of breath (long standing). Negative for chest tightness, cough, hemoptysis and wheezing.   Cardiovascular:  Positive for leg swelling (intermittent, right worse than left). Negative for chest pain and palpitations.  Gastrointestinal: Negative.  Negative for abdominal distention, abdominal pain, blood in stool, constipation, diarrhea, nausea and vomiting.  Endocrine: Negative.   Genitourinary:  Positive for frequency and nocturia. Negative for difficulty urinating, dysuria and hematuria.   Musculoskeletal:  Positive for back pain (right lower back). Negative for arthralgias, flank pain, gait problem and myalgias.       Leg pain which radiates down to the feet  Skin:  Negative.   Neurological: Negative.  Negative for dizziness, extremity weakness, gait problem, headaches, light-headedness, numbness, seizures and speech difficulty.  Hematological: Negative.   Psychiatric/Behavioral: Negative.  Negative for depression and sleep disturbance. The patient is not nervous/anxious.   All other systems reviewed and are negative.   VITALS:  Blood pressure (!) 148/83, pulse 69, temperature 98.5 F  (36.9 C), temperature source Oral, resp. rate 18, height '5\' 3"'$  (1.6 m), weight 151 lb 3.2 oz (68.6 kg), SpO2 97 %.  Wt Readings from Last 3 Encounters:  11/11/20 151 lb 3.2 oz (68.6 kg)  09/04/20 185 lb (83.9 kg)    Body mass index is 26.78 kg/m.  Performance status (ECOG): 1 - Symptomatic but completely ambulatory  PHYSICAL EXAM:  Physical Exam Constitutional:      General: He is not in acute distress.    Appearance: Normal appearance. He is normal weight.  HENT:     Head: Normocephalic and atraumatic.  Eyes:     General: No scleral icterus.    Extraocular Movements: Extraocular movements intact.     Conjunctiva/sclera: Conjunctivae normal.     Pupils: Pupils are equal, round, and reactive to light.  Cardiovascular:     Rate and Rhythm: Normal rate and regular rhythm.     Pulses: Normal pulses.     Heart sounds: Normal heart sounds. No murmur heard.   No friction rub. No gallop.  Pulmonary:     Effort: Pulmonary effort is normal. No respiratory distress.     Breath sounds: Normal breath sounds.  Abdominal:     General: Bowel sounds are normal. There is no distension.     Palpations: Abdomen is soft. There is hepatomegaly (mildly firm and midlly enlarged). There is no splenomegaly or mass.     Tenderness: There is no abdominal tenderness.     Comments: Mid abdomen has a ventral wall hernia just above the umbilicus  Musculoskeletal:        General: Normal range of motion.     Cervical back: Normal range of motion and neck supple.     Right lower leg: No edema.     Left lower leg: No edema.  Lymphadenopathy:     Cervical: No cervical adenopathy.  Skin:    General: Skin is warm and dry.     Findings: Bruising (of both arms) present.  Neurological:     General: No focal deficit present.     Mental Status: He is alert and oriented to person, place, and time. Mental status is at baseline.  Psychiatric:        Mood and Affect: Mood normal.        Behavior: Behavior normal.         Thought Content: Thought content normal.        Judgment: Judgment normal.    LABS:   CBC Latest Ref Rng & Units 09/07/2020 09/06/2020 09/05/2020  WBC 4.0 - 10.5 K/uL 4.6 5.3 7.7  Hemoglobin 13.0 - 17.0 g/dL 10.3(L) 11.5(L) 10.1(L)  Hematocrit 39.0 - 52.0 % 31.1(L) 33.1(L) 30.5(L)  Platelets 150 - 400 K/uL 145(L) 129(L) 182   CMP Latest Ref Rng & Units 09/07/2020 09/06/2020 09/05/2020  Glucose 70 - 99 mg/dL 103(H) 105(H) 135(H)  BUN 8 - 23 mg/dL 16 17 27(H)  Creatinine 0.61 - 1.24 mg/dL 0.92 0.85 1.25(H)  Sodium 135 - 145 mmol/L 137 136 133(L)  Potassium 3.5 - 5.1 mmol/L 3.2(L) 3.5 4.4  Chloride 98 - 111 mmol/L 106 103 105  CO2 22 - 32 mmol/L 25 24 20(L)  Calcium 8.9 - 10.3 mg/dL 8.6(L) 8.6(L) 8.6(L)  Total Protein 6.5 - 8.1 g/dL - - 4.8(L)  Total Bilirubin 0.3 - 1.2 mg/dL - - 2.1(H)  Alkaline Phos 38 - 126 U/L - - 53  AST 15 - 41 U/L - - 26  ALT 0 - 44 U/L - - 13     No results found for: CEA1 / No results found for: CEA1 No results found for: PSA1 No results found for: [id number] No results found for: CAN125  No results found for: TOTALPROTELP, ALBUMINELP, A1GS, A2GS, BETS, BETA2SER, GAMS, MSPIKE, SPEI No results found for: TIBC, FERRITIN, IRONPCTSAT No results found for: LDH  STUDIES:  No results found.   EXAM: 10/09/2020 CT ANGIOGRAPHY CHEST WITH CONTRAST  TECHNIQUE: Multidetector CT imaging of the chest was performed using the standard protocol during bolus administration of intravenous contrast. Multiplanar CT image reconstructions and MIPs were obtained to evaluate the vascular anatomy.  CONTRAST:  70 mL Isovue 370  COMPARISON:  None.  FINDINGS: Cardiovascular: Contrast injection is sufficient to demonstrate satisfactory opacification of the pulmonary arteries to the segmental level. There are small bilateral segmental pulmonary emboli (series 3, image 69 and 94) within the right upper lobe and. The size of the main pulmonary artery is normal. Heart size  is normal, with no pericardial effusion. The course and caliber of the aorta are normal. There is atherosclerotic calcification. No acute aortic syndrome.  Mediastinum/Nodes: Large hiatal hernia.  No lymphadenopathy.  Lungs/Pleura: Airways are patent. No pleural effusion, lobar consolidation, pneumothorax or pulmonary infarction.  Upper Abdomen: Contrast bolus timing is not optimized for evaluation of the abdominal organs. Hepatic cirrhosis and cholelithiasis. Nonobstructing left renal calculi.  Musculoskeletal: No chest wall abnormality. No bony spinal canal stenosis.  Review of the MIP images confirms the above findings.  IMPRESSION: 1. Small bilateral segmental pulmonary emboli. 2. Large hiatal hernia. 3. Hepatic cirrhosis and cholelithiasis. 4. Nonobstructing left renal calculi.  HISTORY:   Past Medical History:  Diagnosis Date   COPD (chronic obstructive pulmonary disease) (HCC)    Coronary artery disease    Diabetes (HCC)    Emphysema of lung (HCC)    GERD (gastroesophageal reflux disease)    Hyperlipidemia    Hypertension    Iron deficiency anemia    Kidney stones    LV dysfunction    MI (myocardial infarction) ([facility])    from previous notes   Mild mitral regurgitation    Sleep apnea    Tobacco abuse     Past Surgical History:  Procedure Laterality Date   CARDIAC CATHETERIZATION Bilateral    CORONARY ANGIOPLASTY WITH STENT PLACEMENT     KIDNEY STONE SURGERY     LITHOTRIPSY     LUMBAR LAMINECTOMY      Family History  Problem Relation Age of Onset   Other Brother    Lymphoma Brother     Social History:  reports that he has been smoking cigarettes. He has a 50.00 pack-year smoking history. He has never used smokeless tobacco. He reports that he does not currently use alcohol. He reports that he does not currently use drugs.The patient is alone today.  He is married and lives at home with his spouse.  He has 5 children, 1 biological.  He does have a  history of alcohol abuse in the remote past. He is retired from work in [employer], and has been exposed to chemicals.  Allergies: No Known Allergies  Current Medications: Current Outpatient Medications  Medication Sig Dispense Refill   apixaban (ELIQUIS) 5 MG TABS tablet Take 5 mg by mouth 2 (two) times daily.     ARIPiprazole (ABILIFY) 10 MG tablet Take 10 mg by mouth every morning.     aspirin 81 MG EC tablet Take 81 mg by mouth daily.     atorvastatin (LIPITOR) 40 MG tablet Take 20 mg by mouth daily.     cyclobenzaprine (FLEXERIL) 5 MG tablet Take 5 mg by mouth 3 (three) times daily as needed for muscle spasms.     metFORMIN (GLUMETZA) 500 MG (MOD) 24 hr tablet Take 500 mg by mouth 2 (two) times daily with a meal.     metoprolol succinate (TOPROL-XL) 100 MG 24 hr tablet Take 150 mg by mouth daily. Patient takes 1 1/2 tablets daily     Oxycodone HCl 10 MG TABS Take 10 mg by mouth 2 (two) times daily. Patient takes a max of 2/day     tamsulosin (FLOMAX) 0.4 MG CAPS capsule Take 0.4 mg by mouth in the morning and at bedtime.     venlafaxine XR (EFFEXOR-XR) 150 MG 24 hr capsule Take 150 mg by mouth every morning.     nitroGLYCERIN (NITROSTAT) 0.4 MG SL tablet Place 0.4 mg under the tongue every 5 (five) minutes as needed for chest pain.     No current facility-administered medications for this visit.     ASSESSMENT & PLAN:   Assessment:   Bilateral small pulmonary emboli.  He was on Plavix until March 2022. He is currently on Eliquis 5 mg BID. As this seems to have been unprovoked, I will evaluate for hypercoagulable state for completeness.  Significant coronary artery disease with NSTEMI while admitted in August, and cardiac stent placement x 6.  Anemia of unknown etiology.  GI evaluation has been negative.  I will evaluate for nutritional deficiency with iron studies, B12 and folate, and notify him if he needs to get back on his iron supplement.  Elevated CEA of 5.0, and CA 19-9 of  36. I do not think these are significant. I will plan to repeat these today.    Dysuria, increased urinary frequency and nocturia.  I will order a UA and culture for further evaluation.  Plan: This is a pleasant 70 year old male who developed bilateral pulmonary emboli.  He is currently on Eliquis 5 mg BID.  I will evaluate for hypercoagulable state for completeness, but this could be related to the discontinuation of Plavix. I recommend that he continue anticoagulant for at least 6 months. As the CEA and CA 19-9 were mildly elevated while he was admitted, I will repeat those markers today.  He is anemic, and GI evaluation has been negative.  I will check for nutritional deficiency with iron studies, B12 and folate.  Since he is complaining of dysuria, increased urinary frequency and nocturia, I will order a UA and culture today.  I will release him back to his primary care. I will not schedule him for follow up here, however, I would be glad to see him back if needed. He understands and agrees with this plan of care.  Thank you for the opportunity to participate in the care of your patients   I provided 45 minutes of face-to-face time during this this encounter and > 50% was spent counseling as documented under my assessment and plan.    Derwood Kaplan, MD [place]  [address] [place] [address] Dept: [phone] Dept Fax: [phone]   I, Rita Ohara, am acting as scribe for Derwood Kaplan, MD  I have reviewed this report as typed by the medical scribe, and it is complete and accurate.

## 2020-11-11 ENCOUNTER — Encounter: Payer: Self-pay | Admitting: Oncology

## 2020-11-11 ENCOUNTER — Other Ambulatory Visit: Payer: Self-pay | Admitting: Oncology

## 2020-11-11 ENCOUNTER — Inpatient Hospital Stay: Payer: Medicare Other | Attending: Oncology | Admitting: Oncology

## 2020-11-11 ENCOUNTER — Inpatient Hospital Stay: Payer: Medicare Other

## 2020-11-11 DIAGNOSIS — M4326 Fusion of spine, lumbar region: Secondary | ICD-10-CM | POA: Diagnosis not present

## 2020-11-11 DIAGNOSIS — Z7984 Long term (current) use of oral hypoglycemic drugs: Secondary | ICD-10-CM | POA: Diagnosis not present

## 2020-11-11 DIAGNOSIS — R970 Elevated carcinoembryonic antigen [CEA]: Secondary | ICD-10-CM | POA: Diagnosis not present

## 2020-11-11 DIAGNOSIS — R351 Nocturia: Secondary | ICD-10-CM | POA: Insufficient documentation

## 2020-11-11 DIAGNOSIS — Z7982 Long term (current) use of aspirin: Secondary | ICD-10-CM | POA: Insufficient documentation

## 2020-11-11 DIAGNOSIS — Z7901 Long term (current) use of anticoagulants: Secondary | ICD-10-CM | POA: Insufficient documentation

## 2020-11-11 DIAGNOSIS — M5416 Radiculopathy, lumbar region: Secondary | ICD-10-CM | POA: Diagnosis not present

## 2020-11-11 DIAGNOSIS — Z79899 Other long term (current) drug therapy: Secondary | ICD-10-CM | POA: Diagnosis not present

## 2020-11-11 DIAGNOSIS — I2699 Other pulmonary embolism without acute cor pulmonale: Secondary | ICD-10-CM | POA: Insufficient documentation

## 2020-11-11 DIAGNOSIS — N200 Calculus of kidney: Secondary | ICD-10-CM | POA: Diagnosis not present

## 2020-11-11 DIAGNOSIS — F17210 Nicotine dependence, cigarettes, uncomplicated: Secondary | ICD-10-CM | POA: Insufficient documentation

## 2020-11-11 DIAGNOSIS — R300 Dysuria: Secondary | ICD-10-CM

## 2020-11-11 DIAGNOSIS — I10 Essential (primary) hypertension: Secondary | ICD-10-CM | POA: Diagnosis not present

## 2020-11-11 DIAGNOSIS — I252 Old myocardial infarction: Secondary | ICD-10-CM | POA: Diagnosis not present

## 2020-11-11 DIAGNOSIS — R978 Other abnormal tumor markers: Secondary | ICD-10-CM | POA: Insufficient documentation

## 2020-11-11 DIAGNOSIS — Z79891 Long term (current) use of opiate analgesic: Secondary | ICD-10-CM | POA: Diagnosis not present

## 2020-11-11 DIAGNOSIS — D649 Anemia, unspecified: Secondary | ICD-10-CM | POA: Insufficient documentation

## 2020-11-11 DIAGNOSIS — G894 Chronic pain syndrome: Secondary | ICD-10-CM | POA: Diagnosis not present

## 2020-11-11 DIAGNOSIS — D539 Nutritional anemia, unspecified: Secondary | ICD-10-CM

## 2020-11-11 DIAGNOSIS — E119 Type 2 diabetes mellitus without complications: Secondary | ICD-10-CM | POA: Diagnosis not present

## 2020-11-11 LAB — VITAMIN B12: Vitamin B-12: 229 pg/mL (ref 180–914)

## 2020-11-11 LAB — CBC AND DIFFERENTIAL
HCT: 35 — AB (ref 41–53)
Hemoglobin: 11.6 — AB (ref 13.5–17.5)
Neutrophils Absolute: 2.79
Platelets: 190 (ref 150–399)
WBC: 4.9

## 2020-11-11 LAB — URINALYSIS, COMPLETE (UACMP) WITH MICROSCOPIC
Bacteria, UA: NONE SEEN
Bilirubin Urine: NEGATIVE
Glucose, UA: >=500 mg/dL — AB
Ketones, ur: NEGATIVE mg/dL
Leukocytes,Ua: NEGATIVE
Nitrite: NEGATIVE
Protein, ur: 30 mg/dL — AB
RBC / HPF: >50 RBC/hpf — ABNORMAL HIGH (ref 0–5)
Specific Gravity, Urine: 1.023 (ref 1.005–1.030)
pH: 5 (ref 5.0–8.0)

## 2020-11-11 LAB — IRON AND TIBC
Iron: 23 ug/dL — ABNORMAL LOW (ref 45–182)
Saturation Ratios: 6 % — ABNORMAL LOW (ref 17.9–39.5)
TIBC: 397 ug/dL (ref 250–450)
UIBC: 374 ug/dL

## 2020-11-11 LAB — BASIC METABOLIC PANEL
BUN: 13 (ref 4–21)
CO2: 20 (ref 13–22)
Chloride: 104 (ref 99–108)
Creatinine: 0.8 (ref 0.6–1.3)
Glucose: 261
Potassium: 4.7 (ref 3.4–5.3)
Sodium: 137 (ref 137–147)

## 2020-11-11 LAB — FERRITIN: Ferritin: 8 ng/mL — ABNORMAL LOW (ref 24–336)

## 2020-11-11 LAB — HEPATIC FUNCTION PANEL
ALT: 19 (ref 10–40)
AST: 24 (ref 14–40)
Alkaline Phosphatase: 73 (ref 25–125)
Bilirubin, Total: 0.6

## 2020-11-11 LAB — COMPREHENSIVE METABOLIC PANEL
Albumin: 4.2 (ref 3.5–5.0)
Calcium: 9.4 (ref 8.7–10.7)

## 2020-11-11 LAB — FOLATE: Folate: 7.6 ng/mL (ref >5.9)

## 2020-11-11 LAB — CBC: RBC: 4.05 (ref 3.87–5.11)

## 2020-11-12 ENCOUNTER — Telehealth: Payer: Self-pay

## 2020-11-12 LAB — CANCER ANTIGEN 19-9: CA 19-9: 41 U/mL — ABNORMAL HIGH (ref 0–35)

## 2020-11-12 LAB — CEA: CEA: 6.1 ng/mL — ABNORMAL HIGH (ref 0.0–4.7)

## 2020-11-12 NOTE — Telephone Encounter (Signed)
Patient notified

## 2020-11-12 NOTE — Telephone Encounter (Signed)
-----   Message from Derwood Kaplan, MD sent at 11/11/2020  7:04 PM EDT ----- Regarding: call pt Tell him we do see blood in his urine (red cells, not white cells) so prob not infection but will wait on culture and let him know.  It could be stones causing the problem and may need to see the urologist

## 2020-11-13 LAB — URINE CULTURE

## 2020-11-15 ENCOUNTER — Other Ambulatory Visit: Payer: Self-pay | Admitting: Oncology

## 2020-11-15 DIAGNOSIS — R978 Other abnormal tumor markers: Secondary | ICD-10-CM

## 2020-11-15 DIAGNOSIS — R970 Elevated carcinoembryonic antigen [CEA]: Secondary | ICD-10-CM

## 2020-11-15 DIAGNOSIS — R8271 Bacteriuria: Secondary | ICD-10-CM

## 2020-11-15 DIAGNOSIS — D539 Nutritional anemia, unspecified: Secondary | ICD-10-CM

## 2020-11-16 LAB — PROTHROMBIN GENE MUTATION

## 2020-11-17 ENCOUNTER — Telehealth: Payer: Self-pay

## 2020-11-17 LAB — FACTOR 5 LEIDEN

## 2020-11-17 NOTE — Telephone Encounter (Signed)
-----   Message from Belva Chimes, LPN sent at [phone]  5:12 PM EDT ----- Regarding: FW: call pt Patient does need to come back in for a repeat urine sample    ----- Message ----- From: Derwood Kaplan, MD Sent: 11/15/2020   5:21 PM EDT To: Belva Chimes, LPN, Adline Mango Subject: call pt                                        Tell him several results are back, will call when others come in:  1. Urine shows bacteria but not definite infection.  I recommend we repeat UA, C&S. 2. B12 level is low normal.  I rec he get on 1 daily, 500 mcg, and we'll recheck next year. 3. Iron level is quite low, he needs to take 1 daily.  I rec he see Dr. Lyndel Safe again for eval later this fall 4. The 2 cancer tests are still sl elevated but sl more than last time.  I still think it is related to his smoking but I do rec they be repeated.  So I rec f/u appt in 3-4 months to repeat these labs, will put in order.  Send copies of labs to [place]

## 2020-11-18 ENCOUNTER — Telehealth: Payer: Self-pay

## 2020-11-18 NOTE — Telephone Encounter (Signed)
Patient notified. Patient will come back into clinic to give a urine sample for UA and C&S

## 2020-11-18 NOTE — Telephone Encounter (Signed)
-----   Message from Derwood Kaplan, MD sent at 11/15/2020  5:15 PM EDT ----- Regarding: call Dustin Welch Tell him several results are back, will call when others come in:  1. Urine shows bacteria but not definite infection.  I recommend we repeat UA, C&S. 2. B12 level is low normal.  I rec he get on 1 daily, 500 mcg, and we'll recheck next year. 3. Iron level is quite low, he needs to take 1 daily.  I rec he see Dr. Lyndel Safe again for eval later this fall 4. The 2 cancer tests are still sl elevated but sl more than last time.  I still think it is related to his smoking but I do rec they be repeated.  So I rec f/u appt in 3-4 months to repeat these labs, will put in order.  Send copies of labs to [facility]

## 2020-12-01 ENCOUNTER — Telehealth: Payer: Self-pay | Admitting: Oncology

## 2020-12-01 NOTE — Telephone Encounter (Signed)
Per 9/11 Staff Msg, patient scheduled for Dec Appt's.  Mailed patient Appt

## 2020-12-14 DIAGNOSIS — M4326 Fusion of spine, lumbar region: Secondary | ICD-10-CM | POA: Diagnosis not present

## 2020-12-14 DIAGNOSIS — M5416 Radiculopathy, lumbar region: Secondary | ICD-10-CM | POA: Diagnosis not present

## 2020-12-20 DIAGNOSIS — Z23 Encounter for immunization: Secondary | ICD-10-CM | POA: Diagnosis not present

## 2021-01-13 DIAGNOSIS — M4326 Fusion of spine, lumbar region: Secondary | ICD-10-CM | POA: Diagnosis not present

## 2021-01-13 DIAGNOSIS — M5416 Radiculopathy, lumbar region: Secondary | ICD-10-CM | POA: Diagnosis not present

## 2021-02-03 ENCOUNTER — Telehealth: Payer: Self-pay | Admitting: Oncology

## 2021-02-03 DIAGNOSIS — T161XXA Foreign body in right ear, initial encounter: Secondary | ICD-10-CM | POA: Diagnosis not present

## 2021-02-03 DIAGNOSIS — Z6824 Body mass index (BMI) 24.0-24.9, adult: Secondary | ICD-10-CM | POA: Diagnosis not present

## 2021-02-03 DIAGNOSIS — H60509 Unspecified acute noninfective otitis externa, unspecified ear: Secondary | ICD-10-CM | POA: Diagnosis not present

## 2021-02-03 DIAGNOSIS — T162XXA Foreign body in left ear, initial encounter: Secondary | ICD-10-CM | POA: Diagnosis not present

## 2021-02-03 DIAGNOSIS — H6121 Impacted cerumen, right ear: Secondary | ICD-10-CM | POA: Diagnosis not present

## 2021-02-03 NOTE — Telephone Encounter (Signed)
Patient's spouse rescheduled 12/13 Labs, Follow Up to 12/27 due No Transportation

## 2021-02-08 NOTE — Progress Notes (Addendum)
ADDENDUM:  His B12 level is improved and the MMA is normal, so he does not need B12 injections.  His iron levels remain low so will still recommend oral supplement.  CEA is up from 6.1 to 6.3.  CA 19-9 is stable at 41.   [place]  [address] [place],  [place]  [address] [phone]  Clinic Day:  02/15/2021  Referring physician: Mateo Flow, MD  This document serves as a record of services personally performed by Hosie Poisson, MD. It was created on their behalf by [facility], a trained medical scribe. The creation of this record is based on the scribe's personal observations and the provider's statements to them.  CHIEF COMPLAINT:  CC: Bilateral pulmonary emboli  Current Treatment:  Eliquis 5 mg BID   HISTORY OF PRESENT ILLNESS:  Dustin Welch is a 70 y.o. male referred by Dr. Armandina Stammer for the evaluation and treatment of pulmonary embolism.  He presented to the [facility] emergency department on August 5th due to abrupt onset substernal chest pain while at rest.  The patient has had previous cardiac trouble and so took nitroglycerin x3, but the pain did not improve.  CTA chest revealed small bilateral segmental pulmonary emboli.  He also developed a NSTEMI during this admission, and so was taken to the intensive care unit and placed on IV Heparin.  He was discharged on August 7th on Eliquis 5 mg BID.  Lab work up at that time revealed a hemoglobin of 11.1 with an MCV of 83.  He was previously on oral iron supplement, but has not taken this lately. He has a smoking history of 50 pack years, but has cut back and is down to 1/2 a pack per day. He notes leg pain which radiates down to his feet. He has had mild lower extremity edema, right worse than left. Bilateral Doppler ultrasound has been negative. Evaluation in September revealed a low normal B12 and iron deficiency. I recommended oral supplement of both.   INTERVAL HISTORY:  Dustin Welch  is here for routine follow up and notes severe back pain. He states that he cannot be scheduled for surgery until he has stopped anticoagulation. He is hypertensive today, but forgot to take his blood pressure medication today. He has not started oral iron or B12 currently, as previously advised. He continues Eliquis 5 mg BID. He denies melena or other forms of overt bleeding. Hemoglobin has decreased from 11.6 to 11.2, and white count and platelets are normal. Chemistries are unremarkable except for a non fasting blood glucose of 198. His  appetite is good, and he has lost 6 and 1/2 pounds since his last visit.  He denies fever, chills or other signs of infection.  He denies nausea, vomiting, bowel issues, or abdominal pain.  He denies sore throat, cough, dyspnea, or chest pain.  REVIEW OF SYSTEMS:  Review of Systems  Constitutional: Negative.  Negative for appetite change, chills, fatigue, fever and unexpected weight change.  HENT:  Negative.    Eyes: Negative.   Respiratory: Negative.  Negative for chest tightness, cough, hemoptysis, shortness of breath and wheezing.   Cardiovascular: Negative.  Negative for chest pain, leg swelling and palpitations.  Gastrointestinal: Negative.  Negative for abdominal distention, abdominal pain, blood in stool, constipation, diarrhea, nausea and vomiting.  Endocrine: Negative.   Genitourinary: Negative.  Negative for difficulty urinating, dysuria, frequency and hematuria.   Musculoskeletal:  Positive for back pain (moderate to severe). Negative for  arthralgias, flank pain, gait problem and myalgias.  Skin: Negative.   Neurological: Negative.  Negative for dizziness, extremity weakness, gait problem, headaches, light-headedness, numbness, seizures and speech difficulty.  Hematological: Negative.   Psychiatric/Behavioral: Negative.  Negative for depression and sleep disturbance. The patient is not nervous/anxious.   All other systems reviewed and are negative.    VITALS:  Blood pressure (!) 198/102, pulse (!) 114, temperature 98.5 F (36.9 C), temperature source Oral, resp. rate 18, height 5\' 3"  (1.6 m), weight 144 lb 8 oz (65.5 kg), SpO2 98 %.  Wt Readings from Last 3 Encounters:  02/15/21 144 lb 8 oz (65.5 kg)  11/11/20 151 lb 3.2 oz (68.6 kg)  09/04/20 185 lb (83.9 kg)    Body mass index is 25.6 kg/m.  Performance status (ECOG): 1 - Symptomatic but completely ambulatory  PHYSICAL EXAM:  Physical Exam Constitutional:      General: He is not in acute distress.    Appearance: Normal appearance. He is normal weight.  HENT:     Head: Normocephalic and atraumatic.  Eyes:     General: No scleral icterus.    Extraocular Movements: Extraocular movements intact.     Conjunctiva/sclera: Conjunctivae normal.     Pupils: Pupils are equal, round, and reactive to light.  Cardiovascular:     Rate and Rhythm: Regular rhythm. Tachycardia present.     Pulses: Normal pulses.     Heart sounds: Normal heart sounds. No murmur heard.   No friction rub. No gallop.  Pulmonary:     Effort: Pulmonary effort is normal. No respiratory distress.     Breath sounds: Normal breath sounds.  Abdominal:     General: Bowel sounds are normal. There is no distension.     Palpations: Abdomen is soft. There is no hepatomegaly, splenomegaly or mass.     Tenderness: There is no abdominal tenderness.  Musculoskeletal:        General: Normal range of motion.     Cervical back: Normal range of motion and neck supple.     Right lower leg: No edema.     Left lower leg: No edema.  Lymphadenopathy:     Cervical: No cervical adenopathy.  Skin:    General: Skin is warm and dry.     Findings: Ecchymosis (scattered of the arms) present.  Neurological:     General: No focal deficit present.     Mental Status: He is alert and oriented to person, place, and time. Mental status is at baseline.  Psychiatric:        Mood and Affect: Mood normal.        Behavior: Behavior  normal.        Thought Content: Thought content normal.        Judgment: Judgment normal.    LABS:   CBC Latest Ref Rng & Units 02/15/2021 11/11/2020 09/07/2020  WBC - 7.8 4.9 4.6  Hemoglobin 13.5 - 17.5 11.2(A) 11.6(A) 10.3(L)  Hematocrit 41 - 53 35(A) 35(A) 31.1(L)  Platelets 150 - 399 253 190 145(L)   CMP Latest Ref Rng & Units 02/15/2021 11/11/2020 09/07/2020  Glucose 70 - 99 mg/dL - - 103(H)  BUN 4 - 21 9 13 16   Creatinine 0.6 - 1.3 0.6 0.8 0.92  Sodium 137 - 147 137 137 137  Potassium 3.4 - 5.3 4.0 4.7 3.2(L)  Chloride 99 - 108 106 104 106  CO2 13 - 22 19 20 25   Calcium 8.7 - 10.7 9.4 9.4 8.6(L)  Total Protein 6.5 - 8.1 g/dL - - -  Total Bilirubin 0.3 - 1.2 mg/dL - - -  Alkaline Phos 25 - 125 104 73 -  AST 14 - 40 29 24 -  ALT 10 - 40 19 19 -     Lab Results  Component Value Date   CEA1 6.1 (H) 11/11/2020   /  CEA  Date Value Ref Range Status  11/11/2020 6.1 (H) 0.0 - 4.7 ng/mL Final    Comment:    (NOTE)                             Nonsmokers          <3.9                             Smokers             <5.6 [employer] Electrochemiluminescence Immunoassay (ECLIA) Values obtained with different assay methods or kits cannot be used interchangeably.  Results cannot be interpreted as absolute evidence of the presence or absence of malignant disease. Performed At: [facility] [place], [place] [id number] Rush Farmer MD KP:[id number]    No results found for: PSA1 Lab Results  Component Value Date   CAN199 41 (H) 11/11/2020   No results found for: TZG017  No results found for: TOTALPROTELP, ALBUMINELP, A1GS, A2GS, BETS, BETA2SER, GAMS, MSPIKE, SPEI Lab Results  Component Value Date   TIBC 397 11/11/2020   FERRITIN 8 (L) 11/11/2020   IRONPCTSAT 6 (L) 11/11/2020   No results found for: LDH  STUDIES:  No results found.    HISTORY:   Allergies: No Known Allergies  Current Medications: Current Outpatient Medications   Medication Sig Dispense Refill   apixaban (ELIQUIS) 5 MG TABS tablet Take 5 mg by mouth 2 (two) times daily.     ARIPiprazole (ABILIFY) 10 MG tablet Take 10 mg by mouth every morning.     aspirin 81 MG EC tablet Take 81 mg by mouth daily.     atorvastatin (LIPITOR) 40 MG tablet Take 20 mg by mouth daily.     cyclobenzaprine (FLEXERIL) 5 MG tablet Take 5 mg by mouth 3 (three) times daily as needed for muscle spasms.     metFORMIN (GLUMETZA) 500 MG (MOD) 24 hr tablet Take 500 mg by mouth 2 (two) times daily with a meal.     metoprolol succinate (TOPROL-XL) 100 MG 24 hr tablet Take 150 mg by mouth daily. Patient takes 1 1/2 tablets daily     nitroGLYCERIN (NITROSTAT) 0.4 MG SL tablet Place 0.4 mg under the tongue every 5 (five) minutes as needed for chest pain.     Oxycodone HCl 10 MG TABS Take 10 mg by mouth 2 (two) times daily. Patient takes a max of 2/day     tamsulosin (FLOMAX) 0.4 MG CAPS capsule Take 0.4 mg by mouth in the morning and at bedtime.     venlafaxine XR (EFFEXOR-XR) 150 MG 24 hr capsule Take 150 mg by mouth every morning.     No current facility-administered medications for this visit.     ASSESSMENT & PLAN:   Assessment:   Bilateral small pulmonary emboli.  He was on Plavix until March 2022. He is currently on Eliquis 5 mg BID, and I recommend at least 6 months of therapy, until February 2023. As this seems to have been  unprovoked, he was evaluated for hypercoagulable state, and this was negative.  Significant coronary artery disease with NSTEMI while admitted in August, and cardiac stent placement x 6.  Elevated CEA of 5.0, and CA 19-9 of 36. Repeat levels from September revealed a CA 19-9 of 41, and CEA was 6.1.   Iron deficiency anemia. He is currently not on oral supplement as previously advised. I again recommend that he start this and he is in agreement. He has not undergone repeat colonoscopy for several years, and so we will refer him to a local gastroenterologist  for further evaluation.  B12 deficiency. We will contact Dr. Humphrey Rolls regarding B12 injections.  Moderate to severe back pain. He is scheduled to meet with a spine specialist this month. However, he would not be able to undergo surgery until he completes anticoagulation in early February.  Plan: He will continue Eliquis 5 mg BID, and I recommend that he continue anticoagulant for at least 6 months, until early February 2023. I again recommend that he start oral iron supplement and I will plan to contact Dr. Laurelyn Sickle office to discuss administering B12 injections. We will refer him to gastroenterology for further evaluation of his anemia. Otherwise, we will see him back in 2 months with CBC, CMP and CEA for repeat evaluation. He understands and agrees with this plan of care.  I provided 20 minutes of face-to-face time during this this encounter and > 50% was spent counseling as documented under my assessment and plan.     Derwood Kaplan, MD [facility] [address] [place] [address] Dept: [phone] Dept Fax: [phone]   I, Rita Ohara, am acting as scribe for Derwood Kaplan, MD  I have reviewed this report as typed by the medical scribe, and it is complete and accurate.

## 2021-02-15 ENCOUNTER — Encounter: Payer: Self-pay | Admitting: Oncology

## 2021-02-15 ENCOUNTER — Telehealth: Payer: Self-pay | Admitting: Oncology

## 2021-02-15 ENCOUNTER — Inpatient Hospital Stay: Payer: Medicare Other | Attending: Oncology

## 2021-02-15 ENCOUNTER — Ambulatory Visit: Payer: Medicare Other | Admitting: Oncology

## 2021-02-15 ENCOUNTER — Other Ambulatory Visit: Payer: Self-pay | Admitting: Oncology

## 2021-02-15 ENCOUNTER — Other Ambulatory Visit: Payer: Medicare Other

## 2021-02-15 ENCOUNTER — Inpatient Hospital Stay (INDEPENDENT_AMBULATORY_CARE_PROVIDER_SITE_OTHER): Payer: Medicare Other | Admitting: Oncology

## 2021-02-15 VITALS — BP 198/102 | HR 114 | Temp 98.5°F | Resp 18 | Ht 63.0 in | Wt 144.5 lb

## 2021-02-15 DIAGNOSIS — R970 Elevated carcinoembryonic antigen [CEA]: Secondary | ICD-10-CM | POA: Diagnosis not present

## 2021-02-15 DIAGNOSIS — I2699 Other pulmonary embolism without acute cor pulmonale: Secondary | ICD-10-CM | POA: Diagnosis not present

## 2021-02-15 DIAGNOSIS — Z79899 Other long term (current) drug therapy: Secondary | ICD-10-CM | POA: Diagnosis not present

## 2021-02-15 DIAGNOSIS — Z7984 Long term (current) use of oral hypoglycemic drugs: Secondary | ICD-10-CM | POA: Diagnosis not present

## 2021-02-15 DIAGNOSIS — D539 Nutritional anemia, unspecified: Secondary | ICD-10-CM | POA: Diagnosis not present

## 2021-02-15 DIAGNOSIS — R978 Other abnormal tumor markers: Secondary | ICD-10-CM | POA: Diagnosis not present

## 2021-02-15 DIAGNOSIS — D649 Anemia, unspecified: Secondary | ICD-10-CM | POA: Diagnosis not present

## 2021-02-15 DIAGNOSIS — Z87891 Personal history of nicotine dependence: Secondary | ICD-10-CM | POA: Diagnosis not present

## 2021-02-15 DIAGNOSIS — D509 Iron deficiency anemia, unspecified: Secondary | ICD-10-CM | POA: Insufficient documentation

## 2021-02-15 DIAGNOSIS — D500 Iron deficiency anemia secondary to blood loss (chronic): Secondary | ICD-10-CM | POA: Diagnosis not present

## 2021-02-15 DIAGNOSIS — Z7901 Long term (current) use of anticoagulants: Secondary | ICD-10-CM | POA: Insufficient documentation

## 2021-02-15 DIAGNOSIS — I2510 Atherosclerotic heart disease of native coronary artery without angina pectoris: Secondary | ICD-10-CM | POA: Diagnosis not present

## 2021-02-15 DIAGNOSIS — Z86711 Personal history of pulmonary embolism: Secondary | ICD-10-CM | POA: Insufficient documentation

## 2021-02-15 DIAGNOSIS — E538 Deficiency of other specified B group vitamins: Secondary | ICD-10-CM | POA: Diagnosis not present

## 2021-02-15 DIAGNOSIS — M549 Dorsalgia, unspecified: Secondary | ICD-10-CM | POA: Insufficient documentation

## 2021-02-15 DIAGNOSIS — Z7982 Long term (current) use of aspirin: Secondary | ICD-10-CM | POA: Insufficient documentation

## 2021-02-15 LAB — CBC AND DIFFERENTIAL
HCT: 35 — AB (ref 41–53)
Hemoglobin: 11.2 — AB (ref 13.5–17.5)
Neutrophils Absolute: 5.07
Platelets: 253 (ref 150–399)
WBC: 7.8

## 2021-02-15 LAB — HEPATIC FUNCTION PANEL
ALT: 19 (ref 10–40)
AST: 29 (ref 14–40)
Alkaline Phosphatase: 104 (ref 25–125)
Bilirubin, Total: 0.7

## 2021-02-15 LAB — BASIC METABOLIC PANEL
BUN: 9 (ref 4–21)
CO2: 19 (ref 13–22)
Chloride: 106 (ref 99–108)
Creatinine: 0.6 (ref 0.6–1.3)
Glucose: 198
Potassium: 4 (ref 3.4–5.3)
Sodium: 137 (ref 137–147)

## 2021-02-15 LAB — IRON AND TIBC
Iron: 33 ug/dL — ABNORMAL LOW (ref 45–182)
Saturation Ratios: 8 % — ABNORMAL LOW (ref 17.9–39.5)
TIBC: 436 ug/dL (ref 250–450)
UIBC: 403 ug/dL

## 2021-02-15 LAB — CBC: RBC: 4.46 (ref 3.87–5.11)

## 2021-02-15 LAB — COMPREHENSIVE METABOLIC PANEL
Albumin: 4.4 (ref 3.5–5.0)
Calcium: 9.4 (ref 8.7–10.7)

## 2021-02-15 LAB — FERRITIN: Ferritin: 6 ng/mL — ABNORMAL LOW (ref 24–336)

## 2021-02-15 LAB — VITAMIN B12: Vitamin B-12: 283 pg/mL (ref 180–914)

## 2021-02-15 NOTE — Telephone Encounter (Signed)
Per 12/12 los next appt scheduled and given to patient 

## 2021-02-17 ENCOUNTER — Other Ambulatory Visit: Payer: Self-pay

## 2021-02-17 DIAGNOSIS — D539 Nutritional anemia, unspecified: Secondary | ICD-10-CM

## 2021-02-17 DIAGNOSIS — M549 Dorsalgia, unspecified: Secondary | ICD-10-CM | POA: Diagnosis not present

## 2021-02-17 DIAGNOSIS — M4326 Fusion of spine, lumbar region: Secondary | ICD-10-CM | POA: Diagnosis not present

## 2021-02-17 DIAGNOSIS — Z86711 Personal history of pulmonary embolism: Secondary | ICD-10-CM | POA: Diagnosis not present

## 2021-02-17 DIAGNOSIS — M5416 Radiculopathy, lumbar region: Secondary | ICD-10-CM | POA: Diagnosis not present

## 2021-02-17 DIAGNOSIS — E538 Deficiency of other specified B group vitamins: Secondary | ICD-10-CM | POA: Diagnosis not present

## 2021-02-17 DIAGNOSIS — D509 Iron deficiency anemia, unspecified: Secondary | ICD-10-CM | POA: Diagnosis not present

## 2021-02-17 DIAGNOSIS — I2510 Atherosclerotic heart disease of native coronary artery without angina pectoris: Secondary | ICD-10-CM | POA: Diagnosis not present

## 2021-02-17 DIAGNOSIS — R970 Elevated carcinoembryonic antigen [CEA]: Secondary | ICD-10-CM | POA: Diagnosis not present

## 2021-02-17 LAB — CEA: CEA: 6.3 ng/mL — ABNORMAL HIGH (ref 0.0–4.7)

## 2021-02-17 LAB — CANCER ANTIGEN 19-9: CA 19-9: 41 U/mL — ABNORMAL HIGH (ref 0–35)

## 2021-02-20 LAB — METHYLMALONIC ACID, SERUM: Methylmalonic Acid, Quantitative: 111 nmol/L (ref 0–378)

## 2021-02-21 DIAGNOSIS — D500 Iron deficiency anemia secondary to blood loss (chronic): Secondary | ICD-10-CM | POA: Insufficient documentation

## 2021-02-22 ENCOUNTER — Telehealth: Payer: Self-pay

## 2021-02-22 NOTE — Telephone Encounter (Signed)
Patient notified. Referral sent to Dr. Carmie End office.

## 2021-02-22 NOTE — Telephone Encounter (Signed)
-----   Message from Derwood Kaplan, MD sent at 02/21/2021  9:12 AM EST ----- Regarding: call Tell him iron levels still low, he needs to take pills.  But B12 level is better, so he will not need to take injections.  I don't know if we had already requested Dr. Laurelyn Sickle office to do, but I placed an addendum on his note.  CEA still mildly elevated at 6.3, up from 6.1. He needs to see Dr. Melina Copa

## 2021-02-23 ENCOUNTER — Other Ambulatory Visit: Payer: Self-pay

## 2021-02-23 DIAGNOSIS — D500 Iron deficiency anemia secondary to blood loss (chronic): Secondary | ICD-10-CM

## 2021-02-23 DIAGNOSIS — D539 Nutritional anemia, unspecified: Secondary | ICD-10-CM

## 2021-03-02 ENCOUNTER — Ambulatory Visit: Payer: Medicare Other | Admitting: Oncology

## 2021-03-02 ENCOUNTER — Other Ambulatory Visit: Payer: Medicare Other

## 2021-03-10 ENCOUNTER — Ambulatory Visit: Payer: Medicare Other | Admitting: Sports Medicine

## 2021-03-17 DIAGNOSIS — M5416 Radiculopathy, lumbar region: Secondary | ICD-10-CM | POA: Diagnosis not present

## 2021-03-17 DIAGNOSIS — M4326 Fusion of spine, lumbar region: Secondary | ICD-10-CM | POA: Diagnosis not present

## 2021-03-19 DIAGNOSIS — L609 Nail disorder, unspecified: Secondary | ICD-10-CM | POA: Diagnosis not present

## 2021-03-19 DIAGNOSIS — E119 Type 2 diabetes mellitus without complications: Secondary | ICD-10-CM | POA: Diagnosis not present

## 2021-03-19 DIAGNOSIS — L603 Nail dystrophy: Secondary | ICD-10-CM | POA: Diagnosis not present

## 2021-04-06 NOTE — Progress Notes (Signed)
[facility]  [address] [place],  [place]  [address] [phone]  Clinic Day:  04/12/2021  Referring physician: Mateo Flow, MD  This document serves as a record of services personally performed by Hosie Poisson, MD. It was created on their behalf by [facility] E, a trained medical scribe. The creation of this record is based on the scribe's personal observations and the provider's statements to them.  CHIEF COMPLAINT:  CC: Bilateral pulmonary emboli  Current Treatment:  Eliquis 5 mg BID   HISTORY OF PRESENT ILLNESS:  Dustin Welch is a 71 y.o. male referred by Dr. Armandina Stammer for the evaluation and treatment of pulmonary embolism.  He presented to the [facility] emergency department on August 5th due to abrupt onset substernal chest pain while at rest.  The patient has had previous cardiac trouble and so took nitroglycerin x3, but the pain did not improve.  CTA chest revealed small bilateral segmental pulmonary emboli.  He also developed a NSTEMI during this admission, and so was taken to the intensive care unit and placed on IV Heparin.  He was discharged on August 7th on Eliquis 5 mg BID.  Lab work up at that time revealed a hemoglobin of 11.1 with an MCV of 83.  He was previously on oral iron supplement, but has not taken this lately. He has a smoking history of 50 pack years, but has cut back and is down to 1/2 a pack per day. He notes leg pain which radiates down to his feet. He has had mild lower extremity edema, right worse than left. Bilateral Doppler ultrasound has been negative. Evaluation in September revealed a low normal B12 and iron deficiency. I recommended oral supplement of both. I saw him back in December and he had not started B12 or iron. A repeat B12 at that time was low normal at 283 with a normal MMA. His hemoglobin had dropped from 11.6 to 11.2 and I encouraged him to start on these supplements.  INTERVAL HISTORY:   Nas is here for routine follow up and states that he never heard from Dr. Carmie End office regarding GI evaluation. We will plan to contact his office again. He states that he has yet to start oral iron or B12 supplements which were recommended in September and again in December. He continues Eliquis 5 mg BID and has completed 6 months of therapy. He does note fatigue and chronic back pain. He is anxious to stop his blood thinner so he can proceed with back surgery. I feel we need to check with cardiology to see if they recommend aspirin alone or Plavix since he did have an MI in August. He also reports dysuria and so we will obtain a UA and culture today. Hemoglobin has again decreased from 11.2 to 10.2 with an MCV of 78, but otherwise, blood counts and chemistries are unremarkable. Blood glucose is 221 after a coffee with sugar. I again admonished how important it is for him to start oral iron supplement twice daily and B12 supplement 500 mcg daily. He would not be able to undergo back surgery with his hemoglobin this low. His  appetite is good, and he has gained 3 and 1/2 pounds since his last visit.  He denies fever, chills or other signs of infection.  He denies nausea, vomiting, bowel issues, or abdominal pain.  He denies sore throat, cough, dyspnea, or chest pain.  REVIEW OF SYSTEMS:  Review of Systems  Constitutional:  Positive for fatigue. Negative for appetite change, chills, fever and unexpected weight change.  HENT:  Negative.    Eyes: Negative.   Respiratory: Negative.  Negative for chest tightness, cough, hemoptysis, shortness of breath and wheezing.   Cardiovascular: Negative.  Negative for chest pain, leg swelling and palpitations.  Gastrointestinal: Negative.  Negative for abdominal distention, abdominal pain, blood in stool, constipation, diarrhea, nausea and vomiting.  Endocrine: Negative.   Genitourinary:  Positive for dysuria. Negative for difficulty urinating, frequency and  hematuria.   Musculoskeletal:  Positive for back pain. Negative for arthralgias, flank pain, gait problem and myalgias.  Skin: Negative.   Neurological: Negative.  Negative for dizziness, extremity weakness, gait problem, headaches, light-headedness, numbness, seizures and speech difficulty.  Hematological: Negative.   Psychiatric/Behavioral: Negative.  Negative for depression and sleep disturbance. The patient is not nervous/anxious.   All other systems reviewed and are negative.   VITALS:  Blood pressure (!) 179/82, pulse 74, temperature 98.2 F (36.8 C), temperature source Oral, resp. rate 20, height 5\' 3"  (1.6 m), weight 147 lb 12.8 oz (67 kg), SpO2 96 %.  Wt Readings from Last 3 Encounters:  04/12/21 147 lb 12.8 oz (67 kg)  02/15/21 144 lb 8 oz (65.5 kg)  11/11/20 151 lb 3.2 oz (68.6 kg)    Body mass index is 26.18 kg/m.  Performance status (ECOG): 1 - Symptomatic but completely ambulatory  PHYSICAL EXAM:  Physical Exam Constitutional:      General: He is not in acute distress.    Appearance: Normal appearance. He is normal weight.  HENT:     Head: Normocephalic and atraumatic.  Eyes:     General: No scleral icterus.    Extraocular Movements: Extraocular movements intact.     Conjunctiva/sclera: Conjunctivae normal.     Pupils: Pupils are equal, round, and reactive to light.  Cardiovascular:     Rate and Rhythm: Normal rate and regular rhythm.     Pulses: Normal pulses.     Heart sounds: Normal heart sounds. No murmur heard.   No friction rub. No gallop.  Pulmonary:     Effort: Pulmonary effort is normal. No respiratory distress.     Breath sounds: Normal breath sounds.  Abdominal:     General: Bowel sounds are normal. There is no distension.     Palpations: Abdomen is soft. There is no hepatomegaly, splenomegaly or mass.     Tenderness: There is no abdominal tenderness.  Musculoskeletal:        General: Normal range of motion.     Cervical back: Normal range of  motion and neck supple.     Right lower leg: No edema.     Left lower leg: No edema.  Lymphadenopathy:     Cervical: No cervical adenopathy.  Skin:    General: Skin is warm and dry.     Coloration: Skin is pale.  Neurological:     General: No focal deficit present.     Mental Status: He is alert and oriented to person, place, and time. Mental status is at baseline.  Psychiatric:        Mood and Affect: Mood normal.        Behavior: Behavior normal.        Thought Content: Thought content normal.        Judgment: Judgment normal.    LABS:   CBC Latest Ref Rng & Units 04/12/2021 02/15/2021 11/11/2020  WBC - 5.0 7.8 4.9  Hemoglobin 13.5 - 17.5 10.2(A)  11.2(A) 11.6(A)  Hematocrit 41 - 53 31(A) 35(A) 35(A)  Platelets 150 - 399 209 253 190   CMP Latest Ref Rng & Units 04/12/2021 02/15/2021 11/11/2020  Glucose 70 - 99 mg/dL - - -  BUN 4 - 21 14 9 13   Creatinine 0.6 - 1.3 0.7 0.6 0.8  Sodium 137 - 147 137 137 137  Potassium 3.4 - 5.3 4.4 4.0 4.7  Chloride 99 - 108 104 106 104  CO2 13 - 22 25(A) 19 20  Calcium 8.7 - 10.7 9.1 9.4 9.4  Total Protein 6.5 - 8.1 g/dL - - -  Total Bilirubin 0.3 - 1.2 mg/dL - - -  Alkaline Phos 25 - 125 79 104 73  AST 14 - 40 22 29 24   ALT 10 - 40 21 19 19      Lab Results  Component Value Date   CEA1 6.3 (H) 02/15/2021   /  CEA  Date Value Ref Range Status  02/15/2021 6.3 (H) 0.0 - 4.7 ng/mL Final    Comment:    (NOTE)                             Nonsmokers          <3.9                             Smokers             <5.6 [employer] Electrochemiluminescence Immunoassay (ECLIA) Values obtained with different assay methods or kits cannot be used interchangeably.  Results cannot be interpreted as absolute evidence of the presence or absence of malignant disease. Performed At: [facility] [place], [place] [id number] Rush Farmer MD WU:[id number]    No results found for: PSA1 Lab Results  Component Value Date    NFA213 41 (H) 02/15/2021   Lab Results  Component Value Date   TIBC 436 02/15/2021   TIBC 397 11/11/2020   FERRITIN 6 (L) 02/15/2021   FERRITIN 8 (L) 11/11/2020   IRONPCTSAT 8 (L) 02/15/2021   IRONPCTSAT 6 (L) 11/11/2020   No results found for: LDH  STUDIES:  No results found.    HISTORY:   Allergies: No Known Allergies  Current Medications: Current Outpatient Medications  Medication Sig Dispense Refill   oxyCODONE-acetaminophen (PERCOCET) 10-325 MG tablet TAKE 1 TABLET BY MOUTH 4 TIMES DAILY AS NEEDED. MAX OF 4/DAY     apixaban (ELIQUIS) 5 MG TABS tablet Take 5 mg by mouth 2 (two) times daily.     ARIPiprazole (ABILIFY) 10 MG tablet Take 10 mg by mouth every morning.     aspirin 81 MG EC tablet Take 81 mg by mouth daily.     atorvastatin (LIPITOR) 40 MG tablet Take 20 mg by mouth daily.     cyclobenzaprine (FLEXERIL) 5 MG tablet Take 5 mg by mouth 3 (three) times daily as needed for muscle spasms.     ferrous sulfate 324 MG TBEC Take 324 mg by mouth daily with breakfast.     metFORMIN (GLUMETZA) 500 MG (MOD) 24 hr tablet Take 500 mg by mouth 2 (two) times daily with a meal.     metoprolol succinate (TOPROL-XL) 100 MG 24 hr tablet Take 150 mg by mouth daily. Patient takes 1 1/2 tablets daily     nitroGLYCERIN (NITROSTAT) 0.4 MG SL tablet Place 0.4 mg under the tongue every 5 (  five) minutes as needed for chest pain.     Oxycodone HCl 10 MG TABS Take 10 mg by mouth 2 (two) times daily. Patient takes a max of 2/day     pantoprazole (PROTONIX) 20 MG tablet Take 20 mg by mouth daily.     tamsulosin (FLOMAX) 0.4 MG CAPS capsule Take 0.4 mg by mouth in the morning and at bedtime.     venlafaxine XR (EFFEXOR-XR) 150 MG 24 hr capsule Take 150 mg by mouth every morning.     No current facility-administered medications for this visit.     ASSESSMENT & PLAN:   Assessment:   Bilateral small pulmonary emboli.  He was on Plavix until March 2022. He is currently on Eliquis 5 mg BID, and  has completed 6 months of therapy. As this seems to have been unprovoked, he was evaluated for hypercoagulable state, and this was negative.  Significant coronary artery disease with NSTEMI while admitted in August, and cardiac stent placement x 6. I think he can stop the Eliquis for his PE but I am sure the cardiologist will want him on some form of blood thinner, at least aspirin, if not Plavix or other agent. He has not followed up with the cardiologist in the last 6 months.  Elevated CEA of 5.0, and CA 19-9 of 36. Repeat levels from December revealed  the CEA is up from 6.1 to 6.3.  CA 19-9 is stable at 41. We will recommend that this be repeated from time to time and he clearly needs colonoscopy.   Iron deficiency anemia. He is currently not on oral supplement as previously advised on two occasions. I again recommend that he start this and he is in agreement. We could administer IV iron but he has not even tried the oral. He has not undergone repeat colonoscopy for several years, and so we will refer him to a local gastroenterologist for further evaluation. He denies rectal bleeding or melena.  B12 deficiency. His B12 level was improved in December at low normal, and the MMA was normal, so we repeated a B12 level today.   Moderate to severe back pain. He is followed by a spine specialist this month. However, he would not be able to undergo surgery until he completes anticoagulation in early February. I explained that he also would not want to proceed with surgery with worsening anemia which has not been evaluated or treated.  Large hiatal hernia. This is largely asymptomatic and explained that it would not require intervention at this time.  Dysuria, which he though was related to the hiatal hernia. I explained that I do not think so, and so we will obtain a urinalysis and urine culture today.  Plan: He now has completed 6 months of Eliquis, and so he may now discontinue. However, his  cardiologist may want him on some form of anticoagulation and I think he needs to be followed up after his MI and multiple cardiac stents. I again admonished that he start oral iron and B12 supplements and wrote the instructions down for him today. We will plan to contact Dr. Carmie End office again regarding GI evaluation for further evaluation of his anemia. I will plan to contact Dr. Humphrey Rolls with my recommendations as he may now take back over his care. He has yet to follow any of my recommendations so far and now his anemia has worsened, so all the more reason to refer to GI. However, I would be glad to see him back if  needed. He understands and agrees with this plan of care.  I provided 20 minutes of face-to-face time during this this encounter and > 50% was spent counseling as documented under my assessment and plan.     Derwood Kaplan, MD [facility] [address] [place] [address] Dept: [phone] Dept Fax: [phone]   I, Rita Ohara, am acting as scribe for Derwood Kaplan, MD  I have reviewed this report as typed by the medical scribe, and it is complete and accurate.

## 2021-04-12 ENCOUNTER — Telehealth: Payer: Self-pay

## 2021-04-12 ENCOUNTER — Encounter: Payer: Self-pay | Admitting: Oncology

## 2021-04-12 ENCOUNTER — Encounter (INDEPENDENT_AMBULATORY_CARE_PROVIDER_SITE_OTHER): Payer: Self-pay

## 2021-04-12 ENCOUNTER — Inpatient Hospital Stay: Payer: Medicare Other | Attending: Oncology | Admitting: Oncology

## 2021-04-12 ENCOUNTER — Inpatient Hospital Stay: Payer: Medicare Other

## 2021-04-12 ENCOUNTER — Other Ambulatory Visit: Payer: Self-pay

## 2021-04-12 VITALS — BP 179/82 | HR 74 | Temp 98.2°F | Resp 20 | Ht 63.0 in | Wt 147.8 lb

## 2021-04-12 DIAGNOSIS — Z7901 Long term (current) use of anticoagulants: Secondary | ICD-10-CM | POA: Insufficient documentation

## 2021-04-12 DIAGNOSIS — D509 Iron deficiency anemia, unspecified: Secondary | ICD-10-CM | POA: Diagnosis not present

## 2021-04-12 DIAGNOSIS — Z7984 Long term (current) use of oral hypoglycemic drugs: Secondary | ICD-10-CM | POA: Insufficient documentation

## 2021-04-12 DIAGNOSIS — R978 Other abnormal tumor markers: Secondary | ICD-10-CM | POA: Diagnosis not present

## 2021-04-12 DIAGNOSIS — E538 Deficiency of other specified B group vitamins: Secondary | ICD-10-CM | POA: Insufficient documentation

## 2021-04-12 DIAGNOSIS — R300 Dysuria: Secondary | ICD-10-CM

## 2021-04-12 DIAGNOSIS — Z7902 Long term (current) use of antithrombotics/antiplatelets: Secondary | ICD-10-CM | POA: Insufficient documentation

## 2021-04-12 DIAGNOSIS — Z86711 Personal history of pulmonary embolism: Secondary | ICD-10-CM | POA: Insufficient documentation

## 2021-04-12 DIAGNOSIS — Z7982 Long term (current) use of aspirin: Secondary | ICD-10-CM | POA: Diagnosis not present

## 2021-04-12 DIAGNOSIS — M79606 Pain in leg, unspecified: Secondary | ICD-10-CM | POA: Diagnosis not present

## 2021-04-12 DIAGNOSIS — Z87891 Personal history of nicotine dependence: Secondary | ICD-10-CM | POA: Insufficient documentation

## 2021-04-12 DIAGNOSIS — D539 Nutritional anemia, unspecified: Secondary | ICD-10-CM | POA: Diagnosis not present

## 2021-04-12 DIAGNOSIS — Z79899 Other long term (current) drug therapy: Secondary | ICD-10-CM | POA: Diagnosis not present

## 2021-04-12 DIAGNOSIS — D649 Anemia, unspecified: Secondary | ICD-10-CM | POA: Diagnosis not present

## 2021-04-12 LAB — URINALYSIS, COMPLETE (UACMP) WITH MICROSCOPIC
Bacteria, UA: NONE SEEN
Bilirubin Urine: NEGATIVE
Glucose, UA: 150 mg/dL — AB
Ketones, ur: NEGATIVE mg/dL
Leukocytes,Ua: NEGATIVE
Nitrite: NEGATIVE
Protein, ur: 100 mg/dL — AB
Specific Gravity, Urine: 1.017 (ref 1.005–1.030)
pH: 5 (ref 5.0–8.0)

## 2021-04-12 LAB — BASIC METABOLIC PANEL
BUN: 14 (ref 4–21)
CO2: 25 — AB (ref 13–22)
Chloride: 104 (ref 99–108)
Creatinine: 0.7 (ref 0.6–1.3)
Glucose: 221
Potassium: 4.4 (ref 3.4–5.3)
Sodium: 137 (ref 137–147)

## 2021-04-12 LAB — CBC AND DIFFERENTIAL
HCT: 31 — AB (ref 41–53)
Hemoglobin: 10.2 — AB (ref 13.5–17.5)
Neutrophils Absolute: 2.45
Platelets: 209 (ref 150–399)
WBC: 5

## 2021-04-12 LAB — COMPREHENSIVE METABOLIC PANEL
Albumin: 4 (ref 3.5–5.0)
Calcium: 9.1 (ref 8.7–10.7)

## 2021-04-12 LAB — VITAMIN B12: Vitamin B-12: 233 pg/mL (ref 180–914)

## 2021-04-12 LAB — HEPATIC FUNCTION PANEL
ALT: 21 (ref 10–40)
AST: 22 (ref 14–40)
Alkaline Phosphatase: 79 (ref 25–125)
Bilirubin, Total: 0.7

## 2021-04-12 LAB — IRON AND TIBC
Iron: 28 ug/dL — ABNORMAL LOW (ref 45–182)
Saturation Ratios: 7 % — ABNORMAL LOW (ref 17.9–39.5)
TIBC: 401 ug/dL (ref 250–450)
UIBC: 373 ug/dL

## 2021-04-12 LAB — FERRITIN: Ferritin: 5 ng/mL — ABNORMAL LOW (ref 24–336)

## 2021-04-12 LAB — CBC: RBC: 4 (ref 3.87–5.11)

## 2021-04-12 NOTE — Telephone Encounter (Signed)
Faxed new referral to Dr. Carmie End office and order placed in [place].

## 2021-04-12 NOTE — Telephone Encounter (Signed)
-----   Message from Derwood Kaplan, MD sent at 04/12/2021 10:50 AM EST ----- Regarding: refer Pls refer again to Dr. Melina Copa regarding iron def anemia and mildly elevated CEA

## 2021-04-12 NOTE — Telephone Encounter (Signed)
Per Dr. Hinton Rao patient states the Dr. Carmie End office did not contact patient with appointment. Called Dr. Carmie End office. Courtney at the front desk states she does not see any appointments in the computer for patient. Loma Sousa states to refax referral and she will make sure he gets appointment with Dr. Melina Copa. Referral re-faxed 04/12/21 to Dr. Melina Copa with updated office note.

## 2021-04-13 LAB — URINE CULTURE: Culture: <10000 — AB

## 2021-04-21 DIAGNOSIS — G894 Chronic pain syndrome: Secondary | ICD-10-CM | POA: Diagnosis not present

## 2021-04-21 DIAGNOSIS — Z79899 Other long term (current) drug therapy: Secondary | ICD-10-CM | POA: Diagnosis not present

## 2021-04-21 DIAGNOSIS — M48062 Spinal stenosis, lumbar region with neurogenic claudication: Secondary | ICD-10-CM | POA: Diagnosis not present

## 2021-04-21 DIAGNOSIS — M5136 Other intervertebral disc degeneration, lumbar region: Secondary | ICD-10-CM | POA: Diagnosis not present

## 2021-04-21 DIAGNOSIS — M532X6 Spinal instabilities, lumbar region: Secondary | ICD-10-CM | POA: Diagnosis not present

## 2021-04-21 DIAGNOSIS — M4316 Spondylolisthesis, lumbar region: Secondary | ICD-10-CM | POA: Diagnosis not present

## 2021-04-21 DIAGNOSIS — Z79891 Long term (current) use of opiate analgesic: Secondary | ICD-10-CM | POA: Diagnosis not present

## 2021-05-19 DIAGNOSIS — M532X6 Spinal instabilities, lumbar region: Secondary | ICD-10-CM | POA: Diagnosis not present

## 2021-05-19 DIAGNOSIS — M4326 Fusion of spine, lumbar region: Secondary | ICD-10-CM | POA: Diagnosis not present

## 2021-05-19 DIAGNOSIS — M4316 Spondylolisthesis, lumbar region: Secondary | ICD-10-CM | POA: Diagnosis not present

## 2021-06-11 DIAGNOSIS — Z9861 Coronary angioplasty status: Secondary | ICD-10-CM | POA: Diagnosis not present

## 2021-06-11 DIAGNOSIS — I25118 Atherosclerotic heart disease of native coronary artery with other forms of angina pectoris: Secondary | ICD-10-CM | POA: Diagnosis not present

## 2021-06-11 DIAGNOSIS — I10 Essential (primary) hypertension: Secondary | ICD-10-CM | POA: Diagnosis not present

## 2021-06-11 DIAGNOSIS — Z01818 Encounter for other preprocedural examination: Secondary | ICD-10-CM | POA: Diagnosis not present

## 2021-06-11 DIAGNOSIS — E800 Hereditary erythropoietic porphyria: Secondary | ICD-10-CM | POA: Diagnosis not present

## 2021-06-11 DIAGNOSIS — I252 Old myocardial infarction: Secondary | ICD-10-CM | POA: Diagnosis not present

## 2021-06-17 DIAGNOSIS — R079 Chest pain, unspecified: Secondary | ICD-10-CM | POA: Diagnosis not present

## 2021-06-17 DIAGNOSIS — J449 Chronic obstructive pulmonary disease, unspecified: Secondary | ICD-10-CM | POA: Diagnosis not present

## 2021-06-17 DIAGNOSIS — F17210 Nicotine dependence, cigarettes, uncomplicated: Secondary | ICD-10-CM | POA: Diagnosis not present

## 2021-06-17 DIAGNOSIS — I469 Cardiac arrest, cause unspecified: Secondary | ICD-10-CM | POA: Diagnosis not present

## 2021-06-17 DIAGNOSIS — R0789 Other chest pain: Secondary | ICD-10-CM | POA: Diagnosis not present

## 2021-06-17 DIAGNOSIS — R739 Hyperglycemia, unspecified: Secondary | ICD-10-CM | POA: Diagnosis not present

## 2021-06-17 DIAGNOSIS — I499 Cardiac arrhythmia, unspecified: Secondary | ICD-10-CM | POA: Diagnosis not present

## 2021-06-17 DIAGNOSIS — M1990 Unspecified osteoarthritis, unspecified site: Secondary | ICD-10-CM | POA: Diagnosis not present

## 2021-07-05 DEATH — deceased

## 2022-05-27 IMAGING — CT CT CHEST-ABD-PELV W/ CM
2 of 5 series · 12 of 36 positions shown, 14 images · IV contrast (Omni 300)
Comparison: CT abdomen pelvis 08/08/2017

CLINICAL DATA: Attention, shock. Found on back porch unresponsive
on the ground. Syncope. GFR 36

EXAM:
CT CHEST, ABDOMEN, AND PELVIS WITH CONTRAST
TECHNIQUE: Multidetector CT imaging of the chest, abdomen and pelvis was
performed following the standard protocol during bolus
administration of intravenous contrast.
CONTRAST:  75mL OMNIPAQUE IOHEXOL 300 MG/ML  SOLN

[Series 3: cap with 5mm st · axial · 0.93mm/px · z∈[-532,-37]mm · 9 of 125 slices shown, 11 images]
[im 13/125  mediastinal]
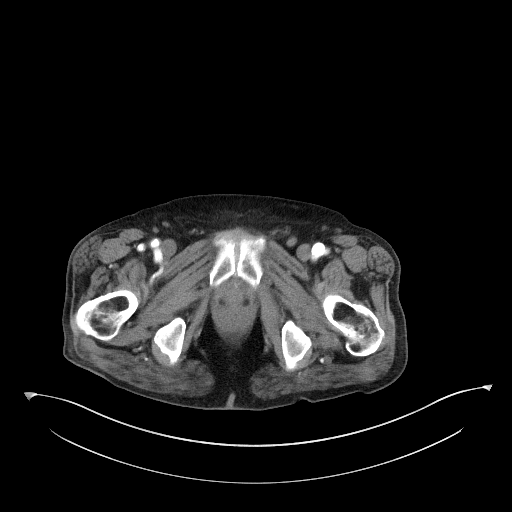
[im 13/125  bone]
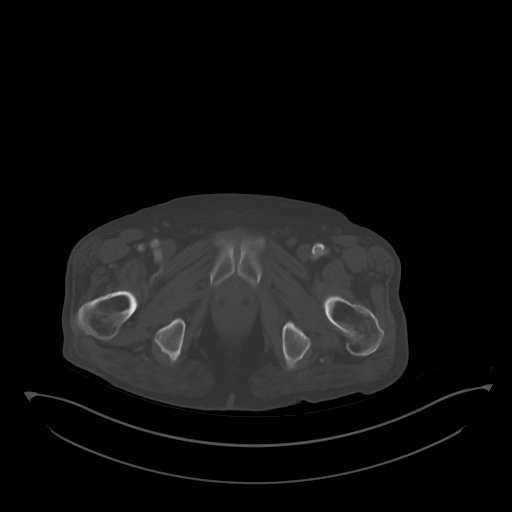
[im 25/125  mediastinal]
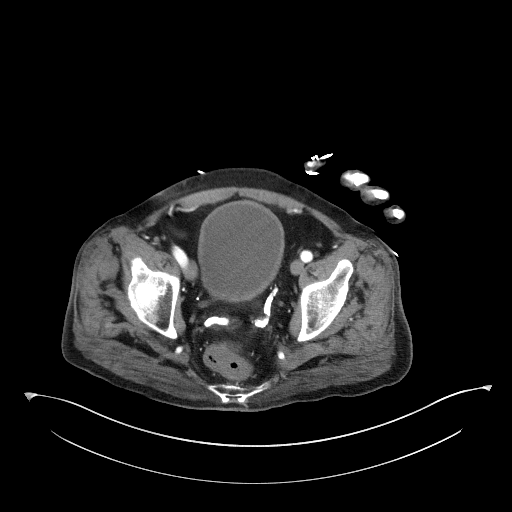
[im 38/125  mediastinal]
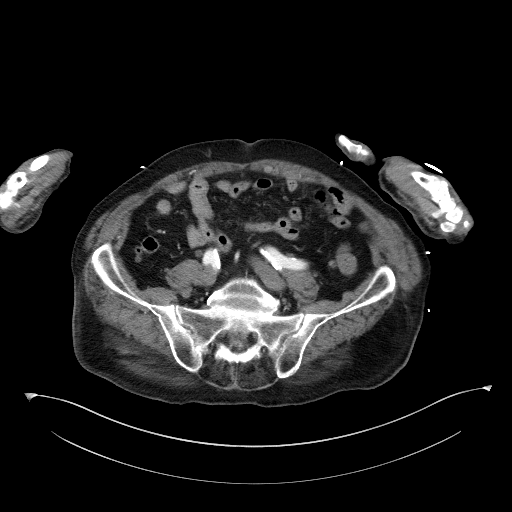
[im 50/125  mediastinal]
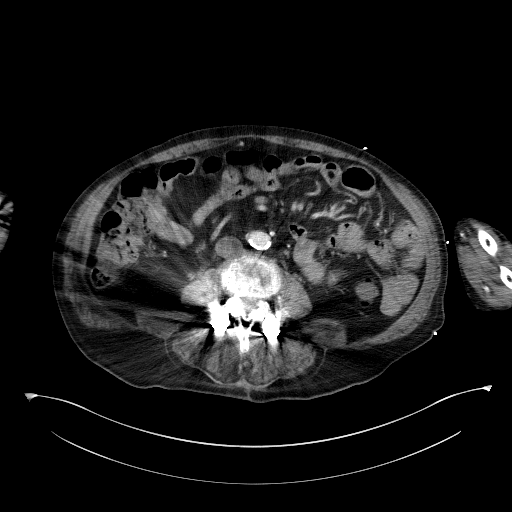
[im 63/125  mediastinal]
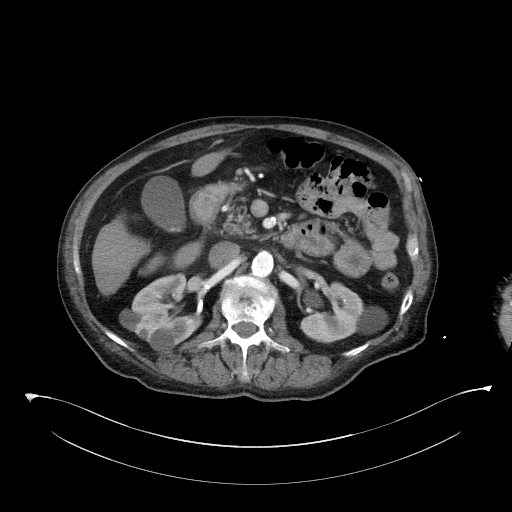
[im 75/125  mediastinal]
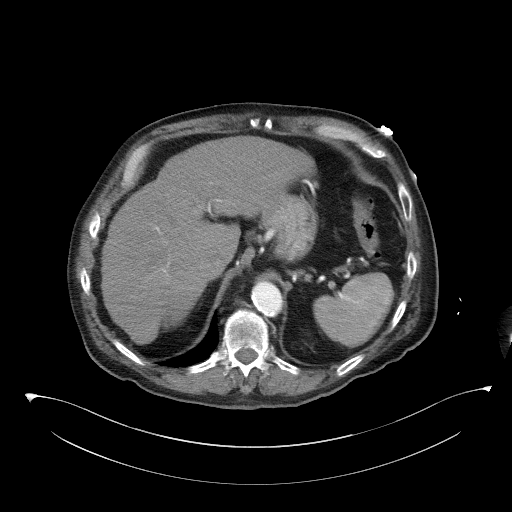
[im 87/125  mediastinal]
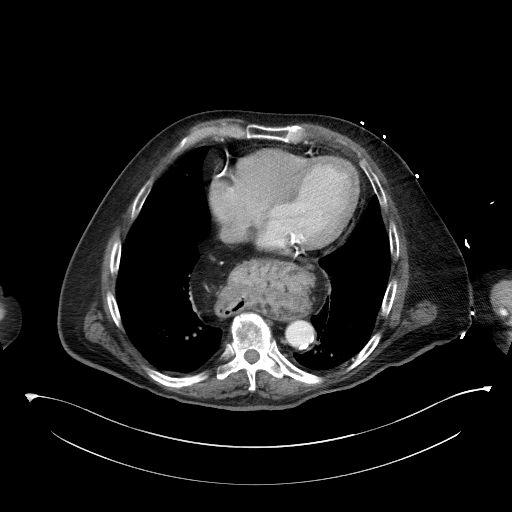
[im 100/125  mediastinal]
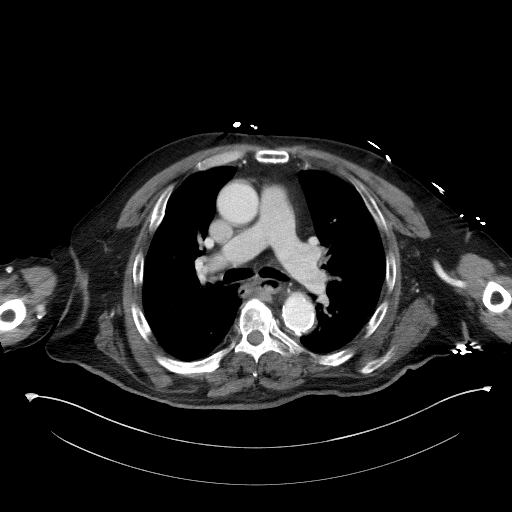
[im 112/125  mediastinal]
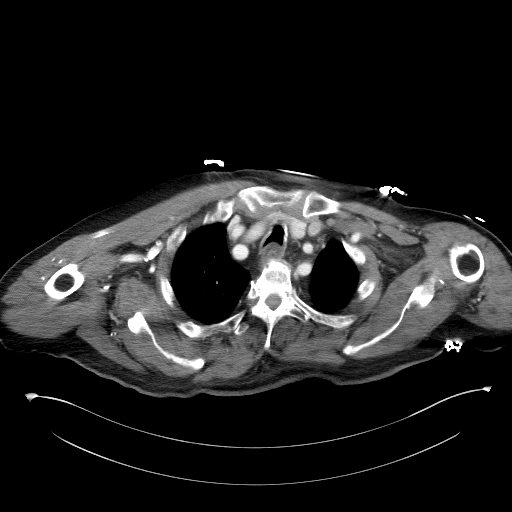
[im 112/125  bone]
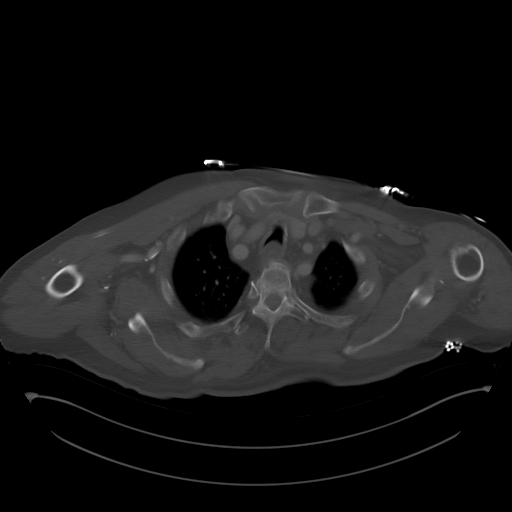

[Series 5: cap with 3mm st cor · coronal · 0.79mm/px · 3 of 167 slices shown]
[im 34/167  mediastinal]
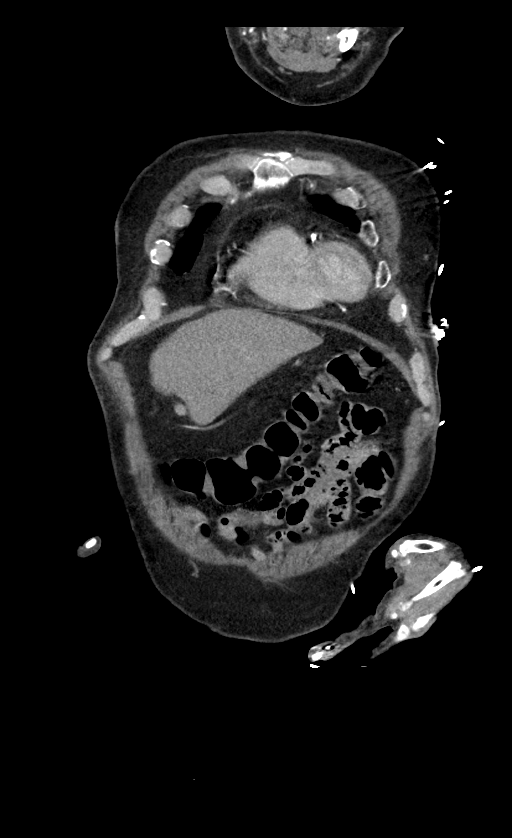
[im 67/167  mediastinal]
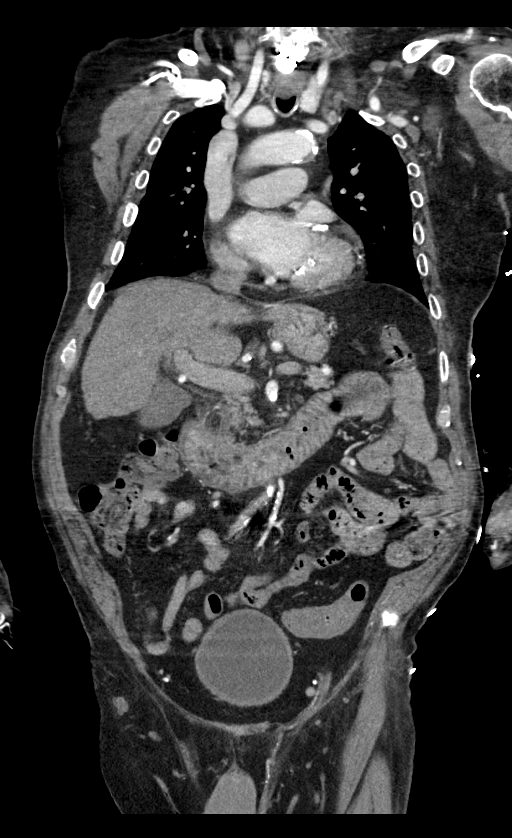
[im 100/167  mediastinal]
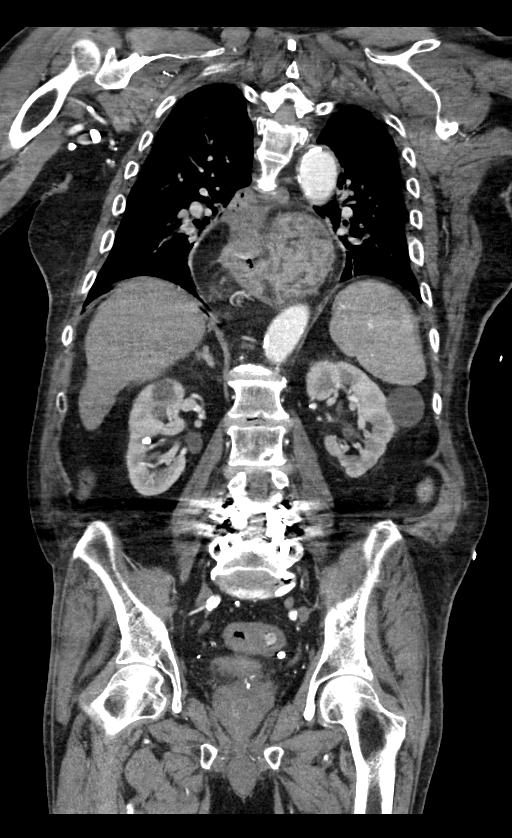

[12 of 36 positions shown; findings below may reference images not displayed]

FINDINGS: CHEST:
Ports and Devices: None.

Lungs/airways:

Expiratory phase of respiration. Bilateral lower lobe subsegmental
atelectasis. Hazy peribronchovascular ground-glass airspace
opacities. Trace debris within the trachea. No focal consolidation.
No pulmonary nodule. No pulmonary mass. No pulmonary contusion or
laceration. No pneumatocele formation.

The central airways are patent.

Pleura: No pleural effusion. No pneumothorax. No hemothorax.

Lymph Nodes: No mediastinal, hilar, or axillary lymphadenopathy.

Mediastinum:

No pneumomediastinum. No aortic injury or mediastinal hematoma.

The thoracic aorta is normal in caliber. Severe atherosclerotic
plaque. The heart is normal in size. No significant pericardial
effusion. Four-vessel coronary calcifications with suggestion of
coronary artery stents. Myocardial thinning and hypodensity along
the left apex changes consistent with prior myocardial infarction.
No central pulmonary embolus.

The esophagus is unremarkable.

The thyroid is unremarkable.

Large hiatal hernia containing the majority of the stomach.

Chest Wall / Breasts: No chest wall mass.

Musculoskeletal: No acute rib or sternal fracture. No spinal
fracture. Multilevel degenerative changes of the spine. Partially
visualized anterior cervical surgical hardware.

ABDOMEN / PELVIS:
Liver: Not enlarged. Slightly nodular hepatic contour. No focal
lesion. No laceration or subcapsular hematoma.

Biliary System: The gallbladder is otherwise unremarkable with no
radio-opaque gallstones. No biliary ductal dilatation.

Pancreas: Normal pancreatic contour. No main pancreatic duct
dilatation.

Spleen: Not enlarged. No focal lesion. No laceration, subcapsular
hematoma, or vascular injury. A splenule is noted.

Adrenal Glands: No nodularity bilaterally.

Kidneys:

Bilateral kidneys enhance symmetrically. Redemonstration of multiple
fluid density lesions within the kidneys likely representing simple
renal cysts. Subcentimeter hypodensities are too small to
characterize. Redemonstration of several bilateral nephrolithiasis
measuring up to 5 mm on the right and 7 mm on the left. No
hydronephrosis. No contusion, laceration, or subcapsular hematoma.

No injury to the vascular structures or collecting systems.
Bilateral mild hydroureter.

The urinary bladder is distending with urine. There is
circumferential urinary bladder wall thickening.

Bowel: Circumferential bowel wall thickening and mucosal hyperemia
of the duodenum. Irregular walls of the second and third portion of
the duodenum with query underlying ulceration. No pneumatosis. No
large bowel wall thickening or dilatation. Diffuse colonic
diverticulosis. The rectosigmoid colon is under distended. The
appendix is unremarkable.

Mesentery, Omentum, and Peritoneum: No simple free fluid ascites. No
pneumoperitoneum. No hemoperitoneum. No mesenteric hematoma
identified. No organized fluid collection.

Pelvic Organs: The prostate is enlarged measuring up to 5 cm.

Lymph Nodes: No abdominal, pelvic, inguinal lymphadenopathy.

Vasculature: Recanalized paraumbilical vein. Severe atherosclerotic
plaque. No abdominal aorta or iliac aneurysm. No active contrast
extravasation or pseudoaneurysm.

Musculoskeletal:

No significant soft tissue hematoma. Tiny fat containing umbilical
hernia.

Status post posterior and interbody L4-L5 surgical fusion. No acute
pelvic fracture. No spinal fracture. Multilevel degenerative changes
of the spine.
IMPRESSION: 1. Pulmonary findings suggestive of aspiration pneumonia with debris
noted within the trachea.
2. Duodenitis with question of ulceration of the second and third
portion of the duodenum. No definite bowel ischemia or perforation.
3. Circumferential urinary bladder wall thickening and bilateral
mild hydroureter. Findings likely due to obstructive uropathy in the
setting of prostatomegaly. Superimposed infection or underlying
malignancy not excluded. Correlate with urinalysis.
4. Cirrhosis with portal hypertension. Limited evaluation for
hepatic lesions on this single phase contrast study. Recommend
nonemergent MRI liver protocol.
5. No acute traumatic injury to the chest, abdomen, or pelvis.

6. No acute fracture or traumatic malalignment of the thoracic or
lumbar spine.

Other imaging findings of potential clinical significance:

1. Similar-appearing large hiatal hernia containing the majority of
the stomach with no findings of bowel obstruction or ischemia.
2. Cholelithiasis no findings of acute cholecystitis.
3. Nonobstructive bilateral nephrolithiasis.
4. Aortic Atherosclerosis ([id number].Z) including four vessel
coronary artery calcifications with findings suggestive of prior
myocardial infarction.
